# Patient Record
Sex: Female | Born: 1935 | Race: White | Hispanic: No | Marital: Single | Smoking: Former smoker
Health system: Southern US, Community
[De-identification: ages and names within clinical notes are randomized; demographics above are authoritative.]

## PROBLEM LIST (undated history)

## (undated) DIAGNOSIS — I340 Nonrheumatic mitral (valve) insufficiency: Secondary | ICD-10-CM

## (undated) DIAGNOSIS — I071 Rheumatic tricuspid insufficiency: Secondary | ICD-10-CM

## (undated) DIAGNOSIS — I480 Paroxysmal atrial fibrillation: Secondary | ICD-10-CM

## (undated) DIAGNOSIS — I10 Essential (primary) hypertension: Secondary | ICD-10-CM

## (undated) HISTORY — DX: Nonrheumatic mitral (valve) insufficiency: I34.0

## (undated) HISTORY — DX: Essential (primary) hypertension: I10

## (undated) HISTORY — DX: Rheumatic tricuspid insufficiency: I07.1

## (undated) HISTORY — PX: TUBAL LIGATION: SHX77

## (undated) HISTORY — DX: Paroxysmal atrial fibrillation: I48.0

---

## 1998-07-30 ENCOUNTER — Other Ambulatory Visit: Admission: RE | Admit: 1998-07-30 | Discharge: 1998-07-30 | Payer: Self-pay | Admitting: Obstetrics and Gynecology

## 1999-10-19 ENCOUNTER — Other Ambulatory Visit: Admission: RE | Admit: 1999-10-19 | Discharge: 1999-10-19 | Payer: Self-pay | Admitting: *Deleted

## 2000-10-20 ENCOUNTER — Other Ambulatory Visit: Admission: RE | Admit: 2000-10-20 | Discharge: 2000-10-20 | Payer: Self-pay | Admitting: *Deleted

## 2000-12-24 ENCOUNTER — Inpatient Hospital Stay (HOSPITAL_COMMUNITY): Admission: EM | Admit: 2000-12-24 | Discharge: 2000-12-26 | Payer: Self-pay | Admitting: Emergency Medicine

## 2000-12-24 ENCOUNTER — Encounter (INDEPENDENT_AMBULATORY_CARE_PROVIDER_SITE_OTHER): Payer: Self-pay | Admitting: Specialist

## 2000-12-24 ENCOUNTER — Encounter: Payer: Self-pay | Admitting: Emergency Medicine

## 2000-12-24 ENCOUNTER — Encounter: Payer: Self-pay | Admitting: General Surgery

## 2001-10-04 ENCOUNTER — Ambulatory Visit: Admission: RE | Admit: 2001-10-04 | Discharge: 2001-10-04 | Payer: Self-pay | Admitting: Gynecology

## 2002-06-13 ENCOUNTER — Ambulatory Visit: Admission: RE | Admit: 2002-06-13 | Discharge: 2002-06-13 | Payer: Self-pay | Admitting: Gynecology

## 2002-06-21 ENCOUNTER — Other Ambulatory Visit: Admission: RE | Admit: 2002-06-21 | Discharge: 2002-06-21 | Payer: Self-pay | Admitting: Obstetrics and Gynecology

## 2002-06-29 ENCOUNTER — Encounter: Payer: Self-pay | Admitting: *Deleted

## 2002-06-29 ENCOUNTER — Ambulatory Visit (HOSPITAL_COMMUNITY): Admission: RE | Admit: 2002-06-29 | Discharge: 2002-06-29 | Payer: Self-pay | Admitting: *Deleted

## 2005-11-24 ENCOUNTER — Encounter: Admission: RE | Admit: 2005-11-24 | Discharge: 2005-11-24 | Payer: Self-pay | Admitting: Family Medicine

## 2007-01-13 ENCOUNTER — Encounter: Admission: RE | Admit: 2007-01-13 | Discharge: 2007-01-13 | Payer: Self-pay | Admitting: Family Medicine

## 2007-01-18 ENCOUNTER — Emergency Department (HOSPITAL_COMMUNITY): Admission: EM | Admit: 2007-01-18 | Discharge: 2007-01-19 | Payer: Self-pay | Admitting: Emergency Medicine

## 2007-11-16 IMAGING — US US AORTA
1 series · 14 of 25 positions shown · non-contrast
Comparison: none

CLINICAL DATA: Follow up small abdominal aortic aneurysm noted on prior ultrasound of [DATE]. 
 ABDOMINAL AORTIC ULTRASOUND:
TECHNIQUE: Complete retroperitoneal ultrasound examination was performed including evaluation of the abdominal aorta, IVC, common iliac vessel origins, and kidneys.

[Series 1: unknown · 0.33mm/px · 14 of 50 slices shown]
[im 1/50]
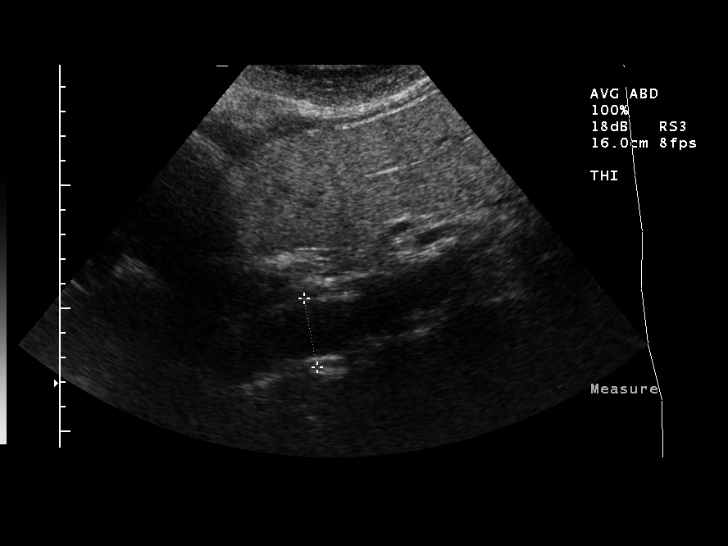
[im 5/50]
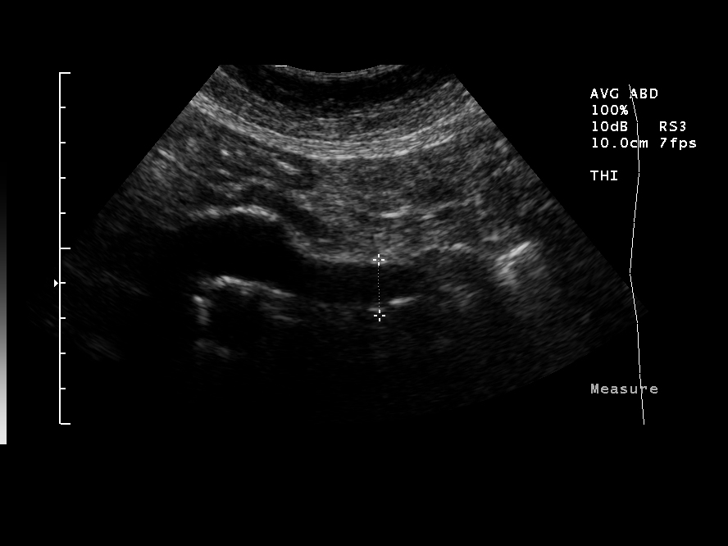
[im 9/50]
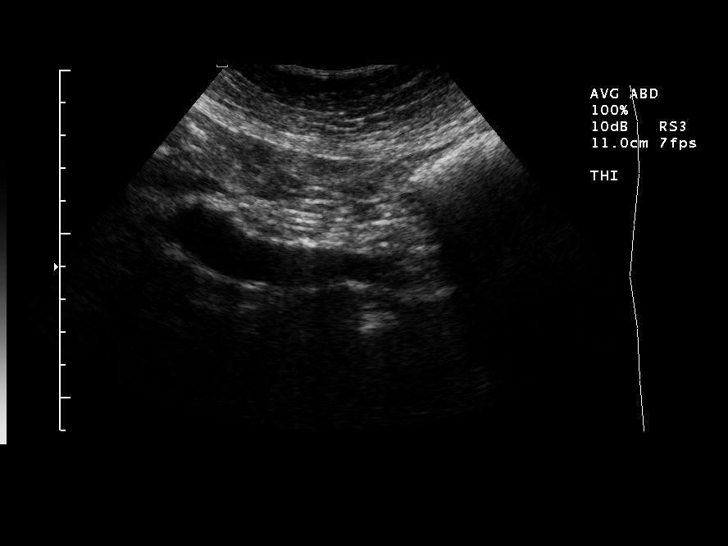
[im 13/50]
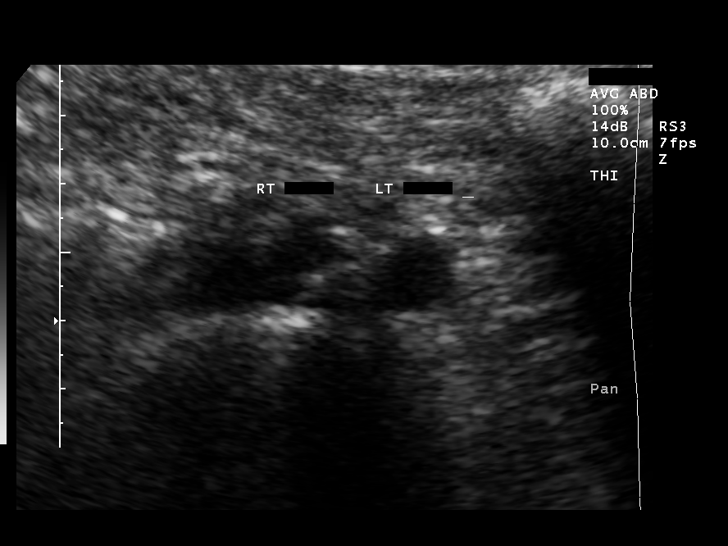
[im 17/50]
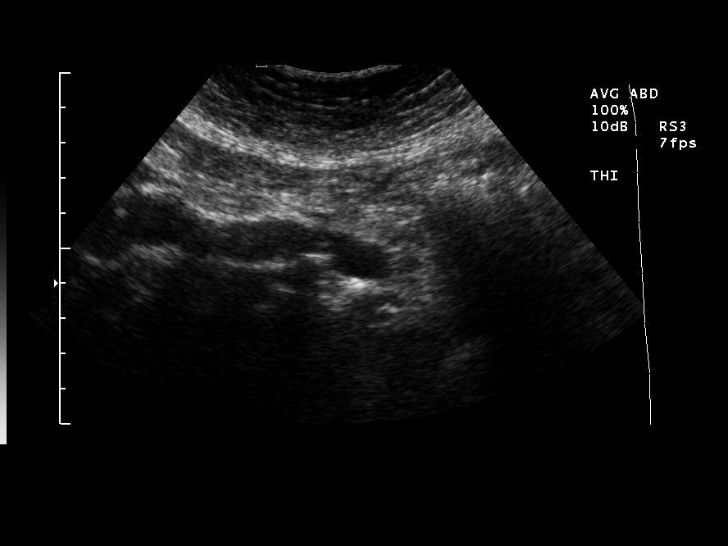
[im 19/50]
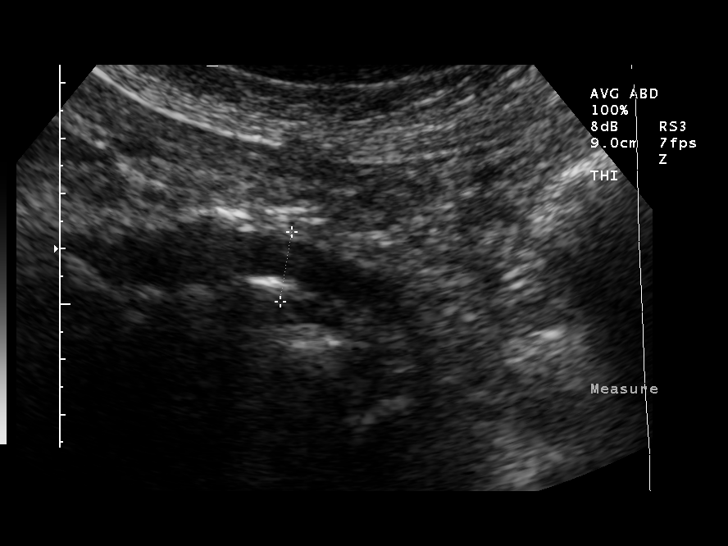
[im 23/50]
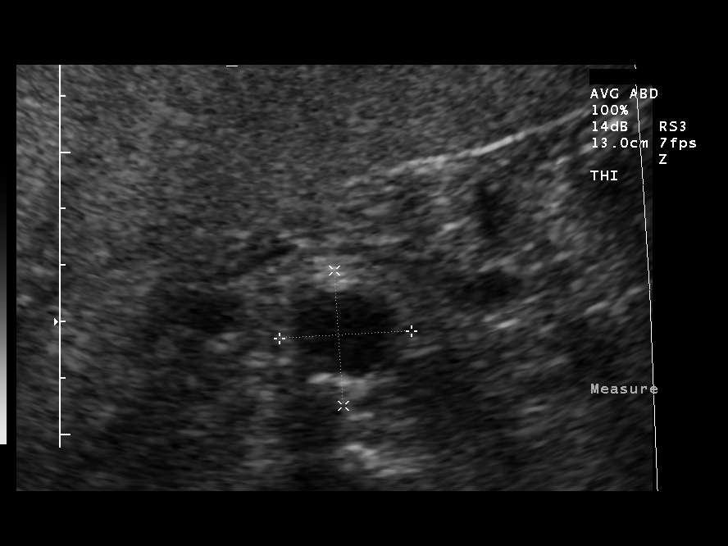
[im 27/50]
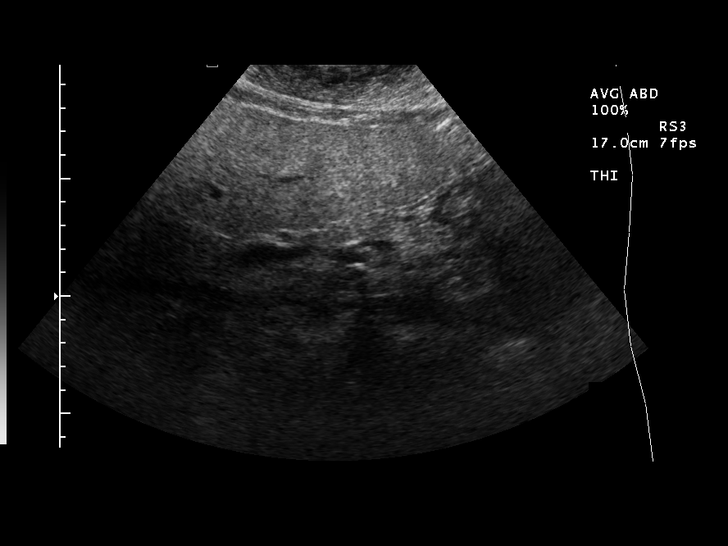
[im 31/50]
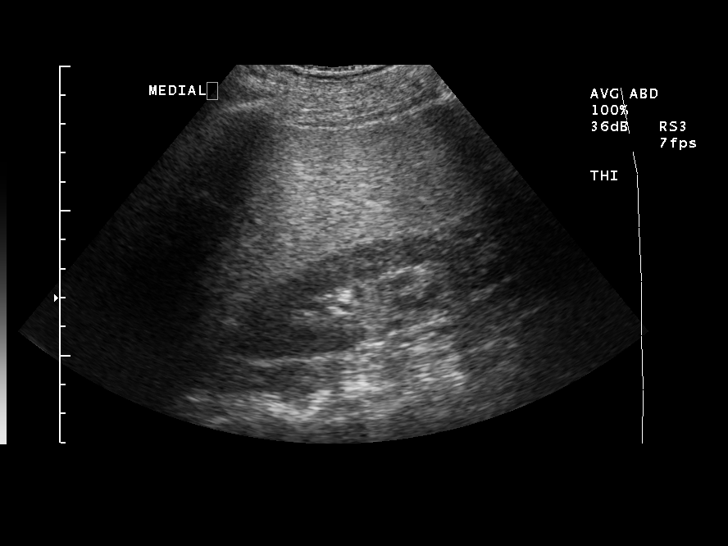
[im 33/50]
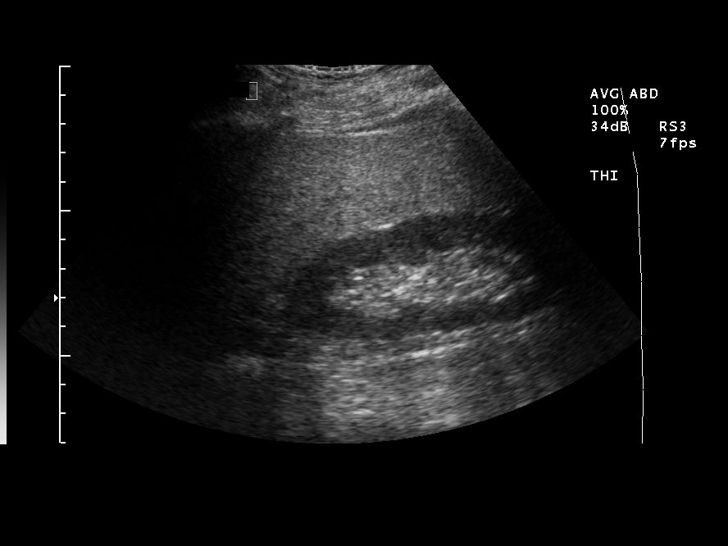
[im 37/50]
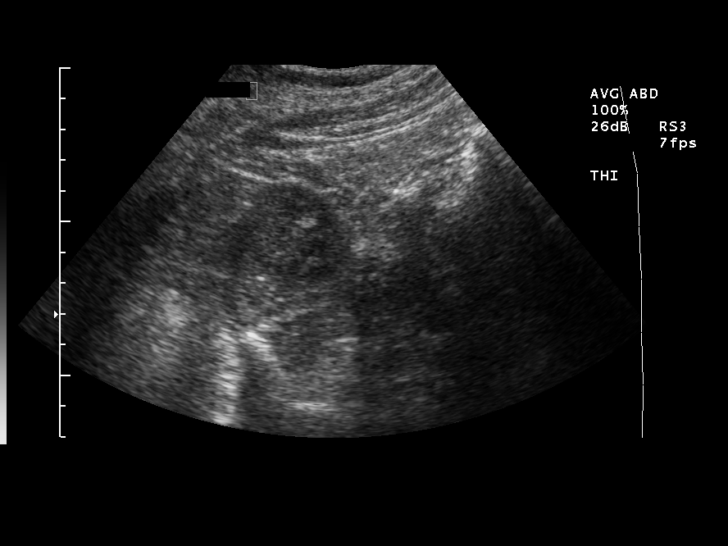
[im 41/50]
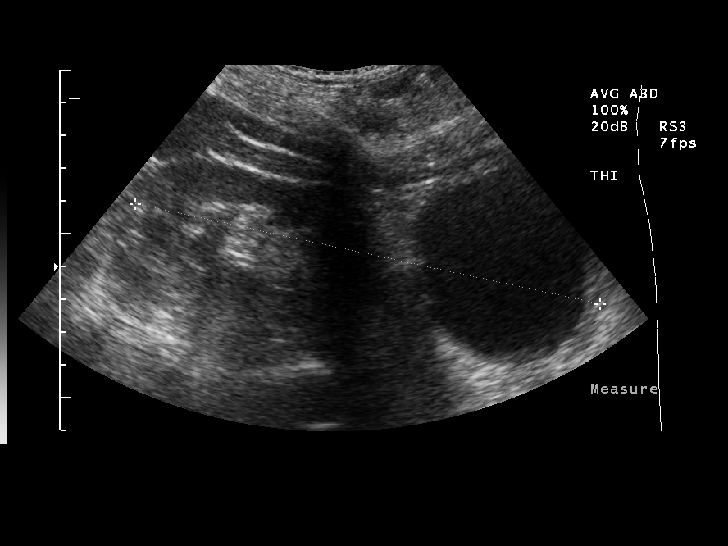
[im 45/50]
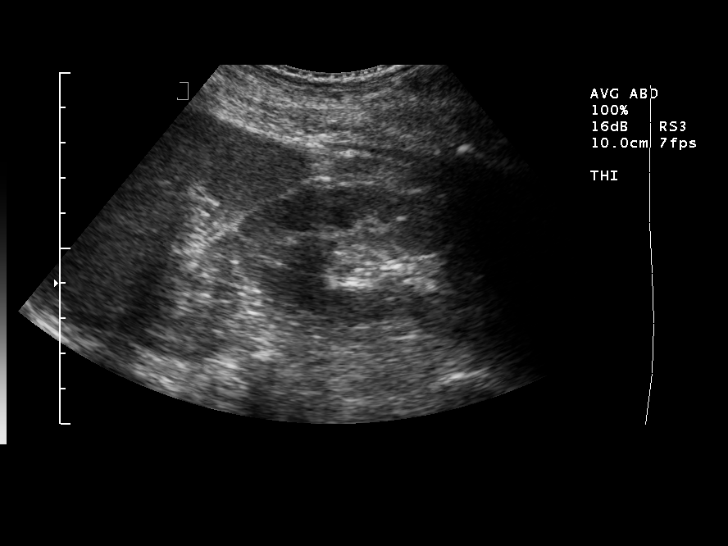
[im 50/50]
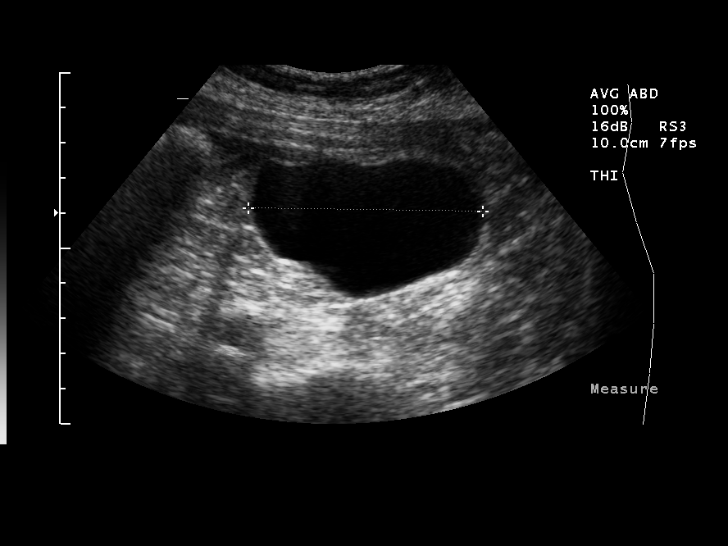

[14 of 25 positions shown; findings below may reference images not displayed]

FINDINGS: On the prior study the small bulge of the abdominal aorta measured 24mm.  The maximum diameter of the abdominal aorta is 29 x 27mm with some atherosclerotic plaque noted.  Continued follow up is recommended.  Common iliac arteries are minimally prominent with the right measuring 15mm in diameter and the left measuring 19mm.  The IVC appears normal.  No hydronephrosis is seen.  The right kidney measures 10.7cm sagittally with the left kidney measuring 14.5cm due to a large cyst inferiorly of 6.7cm.
IMPRESSION: Slight increase in small focal distal abdominal aortic aneurysm now measuring 2.9 x 2.7cm compared to 2.4cm on [DATE].  Recommend continued follow up.

## 2008-04-10 ENCOUNTER — Encounter: Payer: Self-pay | Admitting: Cardiology

## 2009-02-18 ENCOUNTER — Encounter: Payer: Self-pay | Admitting: Cardiology

## 2009-02-18 ENCOUNTER — Ambulatory Visit: Payer: Self-pay | Admitting: Cardiology

## 2009-02-22 ENCOUNTER — Encounter: Payer: Self-pay | Admitting: Cardiology

## 2009-03-11 DIAGNOSIS — I10 Essential (primary) hypertension: Secondary | ICD-10-CM

## 2009-03-11 DIAGNOSIS — I4891 Unspecified atrial fibrillation: Secondary | ICD-10-CM | POA: Insufficient documentation

## 2009-07-08 ENCOUNTER — Ambulatory Visit: Payer: Self-pay | Admitting: Cardiology

## 2009-07-08 DIAGNOSIS — M79609 Pain in unspecified limb: Secondary | ICD-10-CM | POA: Insufficient documentation

## 2009-07-22 ENCOUNTER — Telehealth (INDEPENDENT_AMBULATORY_CARE_PROVIDER_SITE_OTHER): Payer: Self-pay | Admitting: *Deleted

## 2009-07-28 ENCOUNTER — Ambulatory Visit: Payer: Self-pay | Admitting: Cardiology

## 2009-07-30 ENCOUNTER — Telehealth (INDEPENDENT_AMBULATORY_CARE_PROVIDER_SITE_OTHER): Payer: Self-pay | Admitting: *Deleted

## 2009-08-04 ENCOUNTER — Ambulatory Visit: Payer: Self-pay | Admitting: Cardiology

## 2009-08-05 ENCOUNTER — Telehealth: Payer: Self-pay | Admitting: Cardiology

## 2009-08-12 ENCOUNTER — Ambulatory Visit: Payer: Self-pay | Admitting: Cardiology

## 2009-08-15 ENCOUNTER — Encounter: Payer: Self-pay | Admitting: Internal Medicine

## 2009-08-27 ENCOUNTER — Ambulatory Visit: Payer: Self-pay

## 2009-08-27 DIAGNOSIS — I080 Rheumatic disorders of both mitral and aortic valves: Secondary | ICD-10-CM

## 2009-12-30 ENCOUNTER — Telehealth: Payer: Self-pay | Admitting: Cardiology

## 2010-03-04 ENCOUNTER — Ambulatory Visit: Payer: Self-pay | Admitting: Cardiology

## 2010-05-29 ENCOUNTER — Telehealth: Payer: Self-pay | Admitting: Cardiology

## 2010-12-10 NOTE — Progress Notes (Signed)
Summary: palp, sob x4 days- would like to be seen today**LM/nm  Phone Note Call from Patient Call back at Home Phone [phone]   Caller: Patient Reason for Call: Talk to Nurse Complaint: Breathing Problems Summary of Call: per pt calling c/o palps, sob x4 days. pt aware that jh/ pam are  not in the office. pt asking to be seen today.  Initial call taken by: Lorne Skeens,  May 29, 2010 10:01 AM  Follow-up for Phone Call        [facility]. Ollen Gross, RN, BSN  May 29, 2010 11:26 AM  Pt called back. She states yesterday and today about 10:00 when she was outdors watering her plants. She started having  palpitatations and problems with breathing. She felt like her heart was in her stomach area onder left breast. She decided to stay in the house today and rest after the last episode. She states feels better now. Pt denies palpitations , SOB. Pt states" I am feeling better now that I am in a cool place" Pt. adviced to stay indors in a high temperatue weather. Pt. verbalized understanding.  Follow-up by: Ollen Gross, RN, BSN,  May 29, 2010 4:44 PM

## 2010-12-10 NOTE — Progress Notes (Signed)
Summary: SOB,HEAVY IN CHEST  Phone Note Call from Patient Call back at Home Phone [phone]   Caller: Patient Summary of Call: SOB,AND HEAVY IN CHEST Initial call taken by: Judie Grieve,  December 30, 2009 4:23 PM  Follow-up for Phone Call        Spoke with pt. She complains of increased SOB since last PM and throughout day today.  Also complain of uncomfortable feeling in upper stomach/lower chest.  States it is been constant since last evening.  Also notes difficulty taking a deep breath and that she is retaining fluid (swelling in ankles).  Has not weighed herself.  Wondering if she needs to come in to see Dr. Coquille Lions.  States she sees him in [place].  Told pt I would forward to [facility] and have her call pt. Dossie Arbour, RN, BSN  December 30, 2009 4:35 PM   Additional Follow-up for Phone Call Additional follow up Details #1::        NO HX OF CHF NOTED.  DOES HAVE  HISTORY OF A FIB.  ?WHAT IS HEART RATE.  LAST NIGHT AFTER GOING TO BED FELT SOB WHILE LAYING DOWN.  NO PAIN BUT FEELS CHEST IS HEAVY.  HEART RATE USUALLY RUNS ABOUT 55 BMP/   HAS A FEELING OF SORENESS IN EPIGASTRIC AREA.   HR 61  BP 138/87 A LITTLE HIGH FOR HER. DOES NOT FEEL LIKE HER HEART IS OUT OF RHYTHM.  INSTRUCTED PT TO GO TO ED AT [facility] OF S/S WORSEN.  SHE WOULD PREFER TO WAIT TO SEE DR Declin Rajan IN THE [place] OFFICE.  APPT SCHEDULED HOWEVER PT IS INSTRUCTED TO CALL BACK IF S/S CHANGE BEFORE THEN.  SHE STATES UNDERSTANDING Additional Follow-up by: Charolotte Capuchin, RN,  December 30, 2009 5:49 PM

## 2010-12-10 NOTE — Assessment & Plan Note (Signed)
Summary: [facility]   Visit Type:  Follow-up Primary Provider:  Dr. Marinda Elk  CC:  Atrial Fibrillation.  History of Present Illness: The patient presents for followup of her atrial fibrillation. Coincidentally she went back into this rhythm yesterday. It usually lasts for about 36 hours. She can feel it but it doesn't particularly bother her. She does not describe presyncope or syncope. She is not having any chest pressure, neck or arm discomfort. She has no shortness of breath, PND or orthopnea. She does have some mild ankle edema but she has been on her feet quite a bit.  Current Medications (verified): 1)  Otc Loratidine .... Daily 2)  Cardizem Cd 120 Mg Xr24h-Cap (Diltiazem Hcl Coated Beads) .... One By Mouth Daily 3)  Lanoxin 0.125 Mg Tabs (Digoxin) .... One By Mouth Daily 4)  Simvastatin 40 Mg Tabs (Simvastatin) .... One By Mouth Daily 5)  Benazepril-Hydrochlorothiazide 20-12.5 Mg Tabs (Benazepril-Hydrochlorothiazide) .... One By Mouth Daily 6)  Hydrocodone .... As Needed 7)  Warfarin Sodium 2.5 Mg Tabs (Warfarin Sodium) .... As Directed 8)  Calcium 600 1500 Mg Tabs (Calcium Carbonate) .... Daily 9)  Benadryl 25 Mg Caps (Diphenhydramine Hcl) .... By Mouth At Bedtime  Allergies (verified): 1)  ! Sulfa  Past History:  Past Medical History: Reviewed history from 08/27/2009 and no changes required. HTN x 20 years PAF Moderate mitral regurgitation Moderate tricuspid regurgitation  Past Surgical History: Reviewed history from 05/09/2009 and no changes required. Tubal Ligation  Review of Systems       As stated in the HPI and negative for all other systems.   Vital Signs:  Patient profile:   77 year old female Height:      64 inches Weight:      171 pounds BMI:     29.46 Pulse rate:   77 / minute Resp:     18 per minute BP sitting:   134 / 82  (right arm)  Vitals Entered By: Marrion Coy, CNA (March 04, 2010 11:37 AM)  Physical Exam  General:  Well  developed, well nourished, in no acute distress. Head:  normocephalic and atraumatic Eyes:  PERRLA/EOM intact; conjunctiva and lids normal. Mouth:  Gums and palate normal. Oral mucosa normal. Neck:  Neck supple, no JVD. No masses, thyromegaly or abnormal cervical nodes. Chest Wall:  no deformities or breast masses noted Lungs:  Clear bilaterally to auscultation and percussion. Abdomen:  Bowel sounds positive; abdomen soft and non-tender without masses, organomegaly, or hernias noted. No hepatosplenomegaly. Msk:  Back normal, normal gait. Muscle strength and tone normal. Extremities:  No clubbing or cyanosis. Neurologic:  Alert and oriented x 3. Skin:  Intact without lesions or rashes. Psych:  Normal affect.   Detailed Cardiovascular Exam  Neck    Carotids: Carotids full and equal bilaterally without bruits.      Neck Veins: Normal, no JVD.    Heart    Inspection: no deformities or lifts noted.      Palpation: normal PMI with no thrills palpable.      Auscultation: 2/6 apical holosystolic murmur, nonradiating, no diastolic murmurs, S1 and S2 within normal limits, no S3, no S4  Vascular    Abdominal Aorta: no palpable masses, pulsations, or audible bruits.      Femoral Pulses: normal femoral pulses bilaterally.      Pedal Pulses: normal pedal pulses bilaterally.      Radial Pulses: normal radial pulses bilaterally.      Peripheral Circulation: no clubbing, cyanosis,  or edema noted with normal capillary refill.     Impression & Recommendations:  Problem # 1:  FIBRILLATION, ATRIAL (ICD-427.31) She happens to be in fibrillation right now but is not particularly symptomatic. She will let me know if this does not convert or if it bothers her. Of note we talked at length about Pradaxa.  If she has normal renal function she would be a good candidate for this. She is to see her primary provider soon and I will defer basic metabolic profile workup to him. If this is normal I would be in  favor of this switch. I would be happy to assist in this in any way. Orders: EKG w/ Interpretation (93000)  Her updated medication list for this problem includes:    Lanoxin 0.125 Mg Tabs (Digoxin) ..... One by mouth daily    Warfarin Sodium 2.5 Mg Tabs (Warfarin sodium) .Marland Kitchen... As directed  Problem # 2:  ESSENTIAL HYPERTENSION, BENIGN (ICD-401.1) Her blood pressure is controlled. She will continue the meds as listed.  Problem # 3:  MITRAL INSUFFICIENCY (ICD-396.3) I will try to review outside records of her last echocardiogram to determine the need for surveillance followup.  Problem # 4:  FOOT PAIN, BILATERAL (ICD-729.5) I have taken the liberty of giving her a prescription for Vicodin which she uses sparingly.  Patient Instructions: 1)  Your physician recommends that you schedule a follow-up appointment in: 6 months in [place] with Dr Antoine Poche 2)  Your physician recommends that you continue on your current medications as directed. Please refer to the Current Medication list given to you today. 3)  You have been diagnosed with atrial fibrillation.  Atrial fibrillation is a condition in which one of the upper chambers of the heart has extra electrical cells causing it to beat very fast.  Please see the handout/brochure given to you today for further information. Prescriptions: HYDROCODONE-ACETAMINOPHEN 5-500 MG TABS (HYDROCODONE-ACETAMINOPHEN) one by mouth as needed  #30 x 0   Entered by:   Charolotte Capuchin, RN   Authorized by:   Rollene Rotunda, MD, [facility]   Signed by:   Charolotte Capuchin, RN on 03/04/2010   Method used:   Print then Give to Patient   RxID:   [id number]

## 2010-12-16 ENCOUNTER — Encounter: Payer: Self-pay | Admitting: Cardiology

## 2010-12-16 ENCOUNTER — Ambulatory Visit (INDEPENDENT_AMBULATORY_CARE_PROVIDER_SITE_OTHER): Payer: Medicare Other | Admitting: Cardiology

## 2010-12-16 DIAGNOSIS — I059 Rheumatic mitral valve disease, unspecified: Secondary | ICD-10-CM

## 2010-12-16 DIAGNOSIS — R9431 Abnormal electrocardiogram [ECG] [EKG]: Secondary | ICD-10-CM | POA: Insufficient documentation

## 2010-12-16 DIAGNOSIS — I4891 Unspecified atrial fibrillation: Secondary | ICD-10-CM

## 2010-12-24 NOTE — Assessment & Plan Note (Signed)
Summary: f77m 427.31,424.73mj per pt call rs from bumplist gd/tt   Visit Type:  Follow-up Primary Provider:  Dr. Marinda Elk  CC:  Atrial Fibrillation.  History of Present Illness: The patient presents for followup of atrial fibrillation. Since I last saw her she's had no new complaints. She unfortunately is somewhat limited by knee pain and also having to care for a chronically ill husband. She does occasionally get palpitations and notices these and thinks she goes in and out of fibrillation. However, she has no presyncope or syncope. She denies any chest pressure, neck or arm discomfort though again her functional status is. She does occasionally get some shortness of breath no PND or orthopnea. She said some increased lower extremity swelling. She does tolerate her medications as listed. She complains of burning in her extremities that has been slowly progressive.  Current Medications (verified): 1)  Otc Loratidine .... Daily 2)  Lanoxin 0.125 Mg Tabs (Digoxin) .... One By Mouth Daily 3)  Simvastatin 40 Mg Tabs (Simvastatin) .... One By Mouth Daily 4)  Benazepril-Hydrochlorothiazide 20-12.5 Mg Tabs (Benazepril-Hydrochlorothiazide) .... One By Mouth Daily 5)  Pradaxa 150 Mg Caps (Dabigatran Etexilate Mesylate) .Marland Kitchen.. 1 By Mouth Two Times A Day 6)  Calcium 600 1500 Mg Tabs (Calcium Carbonate) .... Daily 7)  Benadryl 25 Mg Caps (Diphenhydramine Hcl) .... By Mouth At Bedtime 8)  Hydrocodone-Acetaminophen 5-500 Mg Tabs (Hydrocodone-Acetaminophen) .... One By Mouth As Needed  Allergies (verified): 1)  ! Sulfa  Past History:  Past Medical History: Reviewed history from 08/27/2009 and no changes required. HTN x 20 years PAF Moderate mitral regurgitation Moderate tricuspid regurgitation  Past Surgical History: Reviewed history from 05/09/2009 and no changes required. Tubal Ligation  Review of Systems       As stated in the HPI and negative for all other systems.   Vital  Signs:  Patient profile:   77 year old female Height:      64 inches Weight:      168 pounds BMI:     28.94 Pulse rate:   61 / minute Resp:     16 per minute BP sitting:   110 / 60  (right arm)  Vitals Entered By: Marrion Coy, CNA (December 16, 2010 11:53 AM)  Physical Exam  General:  Well developed, well nourished, in no acute distress. Head:  normocephalic and atraumatic Eyes:  PERRLA/EOM intact; conjunctiva and lids normal. Mouth:  Gums and palate normal. Oral mucosa normal. Neck:  Neck supple, no JVD. No masses, thyromegaly or abnormal cervical nodes. Chest Wall:  no deformities or breast masses noted Lungs:  Clear bilaterally to auscultation and percussion. Abdomen:  Bowel sounds positive; abdomen soft and non-tender without masses, organomegaly, or hernias noted. No hepatosplenomegaly. Msk:  Back normal, normal gait. Muscle strength and tone normal. Extremities:  No clubbing or cyanosis. Neurologic:  Alert and oriented x 3. Skin:  Intact without lesions or rashes. Cervical Nodes:  no significant adenopathy Inguinal Nodes:  no significant adenopathy Psych:  Normal affect.   Detailed Cardiovascular Exam  Neck    Carotids: Carotids full and equal bilaterally without bruits.      Neck Veins: Normal, no JVD.    Heart    Inspection: no deformities or lifts noted.      Palpation: normal PMI with no thrills palpable.      Auscultation: 2/6 apical holosystolic murmur, nonradiating, no diastolic murmurs, S1 and S2 within normal limits, no S3, no S4  Vascular    Abdominal  Aorta: no palpable masses, pulsations, or audible bruits.      Femoral Pulses: normal femoral pulses bilaterally.      Pedal Pulses: normal pedal pulses bilaterally.      Radial Pulses: normal radial pulses bilaterally.      Peripheral Circulation: no clubbing, cyanosis, or edema noted with normal capillary refill.     EKG  Procedure date:  12/16/2010  Findings:      Atrial fibrillation, rate 60,  axis within normal limits, early transition in lead V2, deep ST depression in the lateral leads unchanged from previous  Impression & Recommendations:  Problem # 1:  ELECTROCARDIOGRAM, ABNORMAL (ICD-794.31)  Ptinued to have a markedly abnormal EKG. She does have some risk factors and some dyspnea. I would like  to screen her with a stress test but 1) she couldn't walk on a treadmill and 2) her EKG could not be interpreted. Therefore, she will have a pharmacologic perfusion study. It has been several years since any stress testing.  Orders: Nuclear Stress Test (Nuc Stress Test)  Problem # 2:  MITRAL INSUFFICIENCY (ICD-396.3) She has a history of mitral regurgitation and she has not had an echocardiogram several years. I will follow up with one of these. Orders: Echocardiogram (Echo)  Problem # 3:  FIBRILLATION, ATRIAL (ICD-427.31) She tolerates paroxysmal atrial fibrillation and maintain his Coumadin. No change in therapy is indicated. Orders: EKG w/ Interpretation (93000)  Patient Instructions: 1)  Your physician recommends that you schedule a follow-up appointment in: 12 months with Dr Antoine Poche 2)  Your physician recommends that you continue on your current medications as directed. Please refer to the Current Medication list given to you today. 3)  Your physician has requested that you have an echocardiogram.  Echocardiography is a painless test that uses sound waves to create images of your heart. It provides your doctor with information about the size and shape of your heart and how well your heart's chambers and valves are working.  This procedure takes approximately one hour. There are no restrictions for this procedure. 4)  Your physician has requested that you have an exercise stress myoview.  For further information please visit [email or web address]  Please follow instruction sheet, as given.

## 2010-12-31 ENCOUNTER — Other Ambulatory Visit (HOSPITAL_COMMUNITY): Payer: MEDICARE

## 2011-01-04 ENCOUNTER — Telehealth: Payer: Self-pay | Admitting: Cardiology

## 2011-01-05 ENCOUNTER — Telehealth (INDEPENDENT_AMBULATORY_CARE_PROVIDER_SITE_OTHER): Payer: Self-pay | Admitting: Radiology

## 2011-01-06 ENCOUNTER — Encounter: Payer: Self-pay | Admitting: Cardiology

## 2011-01-06 ENCOUNTER — Ambulatory Visit (HOSPITAL_COMMUNITY): Payer: Medicare Other | Attending: Cardiovascular Disease

## 2011-01-06 DIAGNOSIS — R0789 Other chest pain: Secondary | ICD-10-CM

## 2011-01-06 DIAGNOSIS — I252 Old myocardial infarction: Secondary | ICD-10-CM | POA: Insufficient documentation

## 2011-01-06 DIAGNOSIS — I059 Rheumatic mitral valve disease, unspecified: Secondary | ICD-10-CM | POA: Insufficient documentation

## 2011-01-06 DIAGNOSIS — I10 Essential (primary) hypertension: Secondary | ICD-10-CM | POA: Insufficient documentation

## 2011-01-06 DIAGNOSIS — R011 Cardiac murmur, unspecified: Secondary | ICD-10-CM | POA: Insufficient documentation

## 2011-01-06 DIAGNOSIS — I4891 Unspecified atrial fibrillation: Secondary | ICD-10-CM | POA: Insufficient documentation

## 2011-01-07 ENCOUNTER — Telehealth: Payer: Self-pay | Admitting: Cardiology

## 2011-01-14 NOTE — Progress Notes (Signed)
Summary: sob,B/P high,stomach pain  Phone Note Call from Patient Call back at Home Phone [phone]   Caller: Daughter/ Mindy Summary of Call: Pt pain in center of stomach and B/P 172/94 and sob Initial call taken by: Judie Grieve,  January 04, 2011 3:50 PM  Follow-up for Phone Call        pt states she is having indegestion, the worse ever for the last 3 days. She has tried Pepcid, Tums, milk, watered down vinegar all without relief.  Pain started in the center of chest in what is described as the epigastric area.  feels tight, cramping right below sternum, cramping in chest.  Heart out of rythum for 1 month, has a stress test scheduled Wed. BP 145/85  HR 44  states she  called this am and was told if she was having indegestion to call her primary care MD.  She called her primary MD who told her to call 911 and have them check her out and possibly do an EKG.  She has not done this. Had pain in her neck with radiation into right breast for over 1 hour last night, fullness  in chest now.  Some SOB with activity. NO N/V. Does not have SL ntg.  States she is very stress and not sleeping well.  Husband is at home with Hospice - and she doesn't want to live him.  Pt is instructed to call 911 for eval and to be transported to the closes ED.   Pt states she does not know what she will do at this point. Follow-up by: Charolotte Capuchin, RN,  January 04, 2011 4:42 PM

## 2011-01-14 NOTE — Progress Notes (Signed)
Summary: pt has questions       Phone Note Call from Patient Call back at Home Phone [phone]   Caller: Patient Reason for Call: Talk to Nurse, Talk to Doctor, Lab or Test Results Summary of Call: pt had testing yesterday and she wants to talk to you about it and she some questions about it. Initial call taken by: Omer Jack,  January 07, 2011 3:03 PM  Follow-up for Phone Call        Micah Flesher to Dr Foy [place]adal[place] office and had an EKG that he said was about  the same as the copy of the one we gave her at her last office visit.  Pt had a nuc study and 2 D echo yesterday and is wanting results.  I reviewed the results of the nuc study with her pt wants to know what she needs to do.  Pt is aware that the echo has not been signed off on as of yet.  Pt aware once Dr Antoine Poche reviews I will call her back.  Of note - pt's husband has Hospice and is at home.  Pt is very stressed and feels like her chest tightness is coming from that.  Dr Foy [place] started her on Nexium yesterday and she will see how it helps her. Follow-up by: Charolotte Capuchin, RN,  January 07, 2011 3:59 PM  Additional Follow-up for Phone Call Additional follow up Details #1::        Nuclear negative.  Echo no significant abnormalities. Let us know if Nexium doens't help. Additional Follow-up by: Rollene Rotunda, MD, [facility],  January 08, 2011 4:31 PM    Additional Follow-up for Phone Call Additional follow up Details #2::    pt aware of results of testing and states she is feeling better today.  She understands to call back if problems continue otherwise she is due back in one year. Follow-up by: Charolotte Capuchin, RN,  January 08, 2011 5:03 PM

## 2011-01-14 NOTE — Progress Notes (Signed)
Summary: Nuc Pre-Procedure  Phone Note Outgoing Call Call back at Home Phone [phone]   Call placed by: Henrine Screws Call placed to: Patient Reason for Call: Confirm/change Appt Summary of Call: Reviewed information on Myoview Information Sheet (see scanned document for further details).  Spoke with patient.     Nuclear Med Background Indications for Stress Test: Evaluation for Ischemia   History: Echo, Myocardial Perfusion Study  History Comments: 2009- Normal MPS. EF= 65% 2009- Echo- Mod. MR and TR H/O PAF  Symptoms: Chest Pain, Chest Pressure, Chest Tightness, DOE    Nuclear Pre-Procedure Cardiac Risk Factors: Hypertension, Lipids Height (in): 64  Nuclear Med Study Referring MD:  Antoine Poche MD, Fayrene Fearing

## 2011-01-14 NOTE — Assessment & Plan Note (Signed)
Summary: Cardiology Nuclear Testing  Nuclear Med Background Indications for Stress Test: Evaluation for Ischemia   History: Echo, Myocardial Perfusion Study  History Comments: ''09 MPS:EF=65%; '09 Echo:EF=55%, moderate MR/TR; h/o PAF  Symptoms: Chest Pain, Chest Tightness, Dizziness, DOE, Fatigue, Palpitations, Rapid HR, SOB    Nuclear Pre-Procedure Cardiac Risk Factors: Family History - CAD, History of Smoking, Hypertension, Lipids, NIDDM Caffeine/Decaff Intake: None NPO After: 8:30 AM Lungs: Clear.  O2 Sat 98% on RA. IV 0.9% NS with Angio Cath: 20g     IV Site: R Antecubital IV Started by: Stanton Kidney, EMT-P Chest Size (in) 34     Cup Size C     Height (in): 63 Weight (lb): 162 BMI: 28.80  Nuclear Med Study 1 or 2 day study:  1 day     Stress Test Type:  Eugenie Birks Reading MD:  Marca Ancona, MD     Referring MD:  Rollene Rotunda, MD Resting Radionuclide:  Technetium 51m Tetrofosmin     Resting Radionuclide Dose:  10.8 mCi  Stress Radionuclide:  Technetium 8m Tetrofosmin     Stress Radionuclide Dose:  33.0 mCi   Stress Protocol   Lexiscan: 0.4 mg   Stress Test Technologist:  Rea [employer], CMA-N     Nuclear Technologist:  Harlow Asa, CNMT  Rest Procedure  Myocardial perfusion imaging was performed at rest 45 minutes following the intravenous administration of Technetium 65m Tetrofosmin.  Stress Procedure  The patient received IV Lexiscan 0.4 mg over 15-seconds.  Technetium 39m Tetrofosmin injected at 30-seconds.  There was a brief episode of junctional rhythm and PAC's with infusion.  She did c/o chest tightness.  Quantitative spect images were obtained after a 45 minute delay.  QPS Raw Data Images:  Normal; no motion artifact; normal heart/lung ratio. Stress Images:  Normal homogeneous uptake in all areas of the myocardium. Rest Images:  Normal homogeneous uptake in all areas of the myocardium. Subtraction (SDS):  There is no evidence of scar or  ischemia. Transient Ischemic Dilatation:  1.04  (Normal <1.22)  Lung/Heart Ratio:  .25  (Normal <0.45)  Quantitative Gated Spect Images QGS EDV:  89 ml QGS ESV:  31 ml QGS EF:  65 % QGS cine images:  Normal wall motion.    Overall Impression  Exercise Capacity: Lexiscan with no exercise. BP Response: Normal blood pressure response. Clinical Symptoms: chest tight ECG Impression: Baseline 1-2 mm ST depression in leads V4/V5.  Minimal change with infusion.  Overall Impression: Normal stress nuclear study.  Appended Document: Cardiology Nuclear Testing pt aware of results

## 2011-02-03 ENCOUNTER — Other Ambulatory Visit: Payer: Self-pay | Admitting: Orthopedic Surgery

## 2011-02-03 DIAGNOSIS — M25511 Pain in right shoulder: Secondary | ICD-10-CM

## 2011-02-06 ENCOUNTER — Other Ambulatory Visit: Payer: Medicare Other

## 2011-03-26 NOTE — Op Note (Signed)
[facility]  Patient:    Debra Chen, Debra Chen                     MRN: [id number] Proc. Date: 12/24/00 Adm. Date:  [id number] Attending:  Henrene Dodge                           Operative Report  PREOPERATIVE DIAGNOSIS:  Acute cholecystitis and possible common duct stone.  POSTOPERATIVE DIAGNOSIS:  Acute cholecystitis and possible common duct stone.  OPERATION:  Laparoscopic cholecystectomy with cholangiogram.  SURGEON:  Anselm Pancoast. Zachery Dakins, M.D.  ASSISTANT:  Zigmund Daniel, M.D.  ANESTHESIA:  General.  HISTORY:  The patient is a 77 year old Caucasian female who presented to the emergency room this morning with severe abdominal pain, epigastric nature over the last 24 hours.  She says that she has had known gallstones and elected not to proceed with any surgery, but then started having these severe episodes of pain, and when she was seen by the emergency room initially physician, she was in sort of marked of upper abdomen tenderness.  She was given pain medication and repeated ultrasound that shows an acutely edematous gallbladder, very swollen with fluid around it, definitely stones, and a common bile duct of about 7 mm in size.  On laboratory studies, her bilirubin was not elevated, but her alkaline phosphatase and SGOT were both elevated in approximately the 300-400 range.  I discussed with the patient that she definitely has acute cholecystitis.  Her amylase was normal as was the lipase, and whether she has a common duct stone or not, we are not sure, but I would recommend that we proceed on with a laparoscopic cholecystectomy and cholangiogram, and she may need an ERCP post procedure.  The patient is in agreement with this and was given PAS stockings and given 3 g of Unasyn.  DESCRIPTION OF PROCEDURE:  She was taken to the operative suite and induction of general anesthesia endotracheal tube, and an oral tube into the stomach. The  abdomen was prepped with Betadine surgical scrubbing solution and draped in a sterile manner.  She is fairly large.  A small incision was made below the umbilicus and the fascia identified.  This was picked up between the Kochers and a small opening made, and she has kind of a single layer of thinned out area of the fascia at this point.  A traction suture of 0 Vicryl was placed and the Hasson cannula introduced and carbon dioxide infused, and the gallbladder was very swollen and very edematous, but not gangrenous.  The upper 10 mm trocar was placed under direct vision through the falciform after anesthetizing the fascia, and the two lateral 5 mm trocars were placed ________.  The gallbladder was retracted upward and outward.  The proximal gallbladder and the adhesions were taken down around it, opening up the peritoneum.  The cystic duct was identified and clipped flush with the gallbladder.  A small opening was made.  There was definitely a back pressure with kind of thin bile gushing back.  The Taut catheter that had been cut off was introduced into the cystic duct, held in place with a clip, and then a cholangiogram obtained.  The biliary system was definitely dilated, but there was good flow into the duodenum.  We repeated this on I think three different views and could never see a little stone, but she certainly may have a small stone  that we are just not visualizing.  The intrahepatic radicles are dilated, but otherwise normal.  The radiologist came and looked at all the films and views, and feels that no definite stone could be demonstrated, but there is a dilated biliary system, and whether this is acute inflammation because she has passed a common duct stone or whether there is a small stone that we are not visualizing and ________.  The catheter was removed.  The cystic duct was triply clipped and then divided.  The cystic artery was divided.  The deltoid cut proximally,  singly, and distally divided, and then this area of inflamed gallbladder was removed with the hook electrocautery and good hemostasis obtained.  The gallbladder was placed in an Endocatch bag.  The camera was placed in the upper 10 mm trocar and the gallbladder withdrawn through the fascia at the umbilicus.  The fascia was closed with a figure-of-eight, ______.  The pursestring had been placed in a second figure-of-eight.  The wound irrigated and fluid had been removed, and then the carbon dioxide released, and the upper trocar withdrawn.  The patient tolerated the procedure nicely and the subcutaneous wounds were closed with 4-0 Vicryl.  Benzoin and Steri-Strips on the skin.  I will repeat the liver function studies tomorrow.  If they are doing better, I would not proceed with ERCP.  If she continues to have pain and definitely abnormal liver tests, then she will need an ERCP and possibly sphincterotomy.  The patient tolerated the procedure nicely, was extubated, and taken to the recovery room.  We will let her have a diet today, but keep her n.p.o. in the morning in case an ERCP is possibly needed. DD:  12/24/00 TD:  12/25/00 Job: [id number]

## 2011-03-26 NOTE — Consult Note (Signed)
   NAME:  Debra Chen, Debra Chen                        ACCOUNT NO.:  [id number]   MEDICAL RECORD NO.:  [id number]                   PATIENT TYPE:  OUT   LOCATION:  GYN                                  FACILITY:  [facility]   PHYSICIAN:  Daniel L. Clarke-Pearson, M.D.      DATE OF BIRTH:  09/17/1926   DATE OF CONSULTATION:  DATE OF DISCHARGE:                                 GYN CONSULTATION   HISTORY OF PRESENT ILLNESS:  This is a 77 year old white female who returns  because of now increasing drainage from her left vulva.  She was previously  seen on November 27, at which time she was doing fine.  She denies any pain  in this area or any fever.  This is at least the third episode where she has  developed an abscess in this area.   REVIEW OF SYSTEMS:  Otherwise negative.   PHYSICAL EXAMINATION:  VITAL SIGNS:  Weight 196 pounds.  Blood pressure  156/72.  ABDOMEN:  Soft and nontender.  No mass, organomegaly, ascites, or hernias  noted.  PELVIC:  EGBUS is normal except for a slit-like opening about 5 mm long in  the region of the Bartholin gland.  This is draining pus.  Palpation of this  area reveals no masses or nodularity or any large abscess.  Vaginal exam is  otherwise normal.   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  This  has been previously incised and drained and is draining well today.  However, given the chronic nature of this process, I would suggest that it  be removed.   I discussed this patient's problem with Dr. Myrlene Broker, and Dr. Katrinka Blazing will  undertake this procedure once the patient's infection is cleared up.  Today  she is given a prescription for Augmentin 500 mg q.12h.  Dr. Michaelle Copas office  will coordinate with the patient to schedule surgery.   ADDENDUM:  The patient's past medical history is relevant for an arrhythmia,  which I believe is atrial fibrillation, in that she is taking Coumadin.  She  also has hypertension.   CURRENT  MEDICATIONS:  Coumadin, with the dose to achieve an INR of 2.0.  Lotensin.  Zocor.  Evista.  Cartia.  A beta blocker.    PAST SURGICAL HISTORY:  1. Bilateral tubal ligation.  2. Cholecystectomy.   ALLERGIES:  SULFA.                                               Daniel L. Stanford Breed, M.D.    DLC/MEDQ  D:  06/13/2002  T:  06/17/2002  Job:  [id number]   cc:   Laqueta Linden, M.D.  [address]  [place]  [place] [address]  Fax: [phone]   Telford Nab, R.N.

## 2011-03-26 NOTE — Consult Note (Signed)
[facility]  Patient:    Debra Chen, Debra Chen Visit Number: [id number] MRN: [id number]          Service Type: GON Location: GYN Attending Physician:  Jeannette Corpus Dictated by:   Rande Brunt. Clarke-Pearson, M.D. Proc. Date: 10/04/01 Admit Date:  10/04/2001 Discharge Date: 10/04/2001   CC:         Laqueta Linden, M.D.  Telford Nab, R.N.   Consultation Report  A 77 year old white female referred by Dr. Myrlene Broker for consultation regarding management of an apparent Bartholins abscess.  I reviewed records from Dr. Lonn Georgia office and it appears the patient had a vulvar abscess initially treated in their office in August 2001.  This was incised and drained at that time and treated with antibiotics and had good resolution. Apparently, she had another flare of this abscess in August 2002, again being managed with warm soaks and antibiotics.  Her most recent episode was in October 2002.  Currently, the patient is entirely asymptomatic.  This abscess area has resolved spontaneously.  The patient has no other gynecologic history.  She denies any fevers, chills, pain, or masses.  PHYSICAL EXAMINATION  VITAL SIGNS:  Weight 218 pounds, height 5 feet 3 inches, blood pressure 140/82, pulse 68, respiratory rate 16.  GENERAL:  The patient is a pleasant, elderly white female in no acute distress.  ABDOMEN:  Soft, nontender.  No mass, organomegaly, ascites, or hernias are noted.  Specifically, there is no inguinal adenopathy or lesions.  PELVIC:  EGBUS is essentially normal.  There is a small sinus tract in the area of the Bartholins gland region.  Inspection and palpation of this area reveals no masses or nodularity, tenderness, or any purulence.  Vagina is clean.  Cervix is normal.  Uterus is anterior, normal shape, size, consistency.  No adnexal masses are noted.  IMPRESSION:  Recurrent vulvar abscess.  While one would be concerned regarding the  possibility of a neoplasm, I do not feel any masses or nodularity or anything else that makes me suspect a neoplastic process.  If this continues to reoccur, then I think wide excision of this region would be most appropriate.  At the present time it is entirely quiescent and therefore would suggest observation until another reoccurrence.  The patient will return to the care of Dr. Myrlene Broker but I will be happy to see her again in the future if she has a reoccurrence. Dictated by:   Rande Brunt. Clarke-Pearson, M.D. Attending Physician:  Jeannette Corpus DD:  10/10/01 TD:  10/10/01 Job: [id number]

## 2011-06-11 ENCOUNTER — Encounter: Payer: Self-pay | Admitting: Cardiology

## 2012-03-06 ENCOUNTER — Encounter: Payer: Self-pay | Admitting: Cardiology

## 2012-04-14 ENCOUNTER — Telehealth: Payer: Self-pay | Admitting: Cardiology

## 2012-04-14 NOTE — Telephone Encounter (Signed)
PT HAVING SOB, FEELS "HAZY" , PLS CALL

## 2012-04-14 NOTE — Telephone Encounter (Signed)
On Wednesday pt noticed she was swelling in her legs and having some shortness of breath.  She saw her PCP who started her on Lasix however she was not able to start it until Thursday night.  She has now had two doses but doesn't feel like her swelling is much better.  Advised pt to call PCP back to let them know since they started the treatment and have recent labs on her. (kidney function-K+, etc) her BP is 128/60 per her report.  She will call her PCP for further orders and will follow up as scheduled 7/3 with Dr Antoine Poche

## 2012-05-04 ENCOUNTER — Telehealth: Payer: Self-pay | Admitting: Cardiology

## 2012-05-04 NOTE — Telephone Encounter (Signed)
Left message to call back  

## 2012-05-04 NOTE — Telephone Encounter (Signed)
New Problem:    Patient called in because she has been having issues and was wondering if she needed to come in earlier.  Please call back

## 2012-05-05 NOTE — Telephone Encounter (Signed)
Spoke with pt who states she had been having palpitations and rapid HR - she felt as though she would "black out" at times. She saw Dr Foy Guadalajara who stopped her Amitriptyline.  She has been off for 2 days now and feels some better.  She will keep appointment as scheduled in [place] on Wednesday.  She will call down if s/s worsen prior to then.

## 2012-05-05 NOTE — Telephone Encounter (Signed)
Lm to call back

## 2012-05-10 ENCOUNTER — Ambulatory Visit (INDEPENDENT_AMBULATORY_CARE_PROVIDER_SITE_OTHER): Payer: Medicare Other | Admitting: Cardiology

## 2012-05-10 ENCOUNTER — Encounter: Payer: Self-pay | Admitting: Cardiology

## 2012-05-10 VITALS — BP 140/60 | HR 47 | Ht 62.0 in | Wt 156.0 lb

## 2012-05-10 DIAGNOSIS — R001 Bradycardia, unspecified: Secondary | ICD-10-CM | POA: Insufficient documentation

## 2012-05-10 DIAGNOSIS — I719 Aortic aneurysm of unspecified site, without rupture: Secondary | ICD-10-CM

## 2012-05-10 DIAGNOSIS — I498 Other specified cardiac arrhythmias: Secondary | ICD-10-CM

## 2012-05-10 DIAGNOSIS — I080 Rheumatic disorders of both mitral and aortic valves: Secondary | ICD-10-CM

## 2012-05-10 DIAGNOSIS — I4891 Unspecified atrial fibrillation: Secondary | ICD-10-CM

## 2012-05-10 NOTE — Progress Notes (Signed)
HPI The patient presents for followup of atrial fibrillation and mitral regurgitation.  She called last week to report symptoms of feeling dizzy. She has been treated recently with amitriptyline for neuropathy. Her primary provider stop this and over the past daily she has felt much better. When she was having her dizziness she was having more tachypalpitations that since she has had before. She thinks this is her fibrillation. However, it was more sustained. She did not have any frank syncope. She hasn't had any chest pressure, neck or arm discomfort. She hasn't had any shortness of breath, PND or orthopnea. She's had no weight gain or edema. She actually feels much better the last couple of days then she has in the last few weeks.  Allergies  Allergen Reactions  . Sulfonamide Derivatives     Current Outpatient Prescriptions  Medication Sig Dispense Refill  . atorvastatin (LIPITOR) 20 MG tablet Take 20 mg by mouth daily.      . benazepril-hydrochlorthiazide (LOTENSIN HCT) 20-12.5 MG per tablet Take 1 tablet by mouth daily.        . Calcium Carbonate (CALCIUM 600) 1500 MG TABS Take 1 tablet by mouth daily.        . digoxin (LANOXIN) 0.125 MG tablet Take 125 mcg by mouth daily.        . diphenhydrAMINE (SOMINEX) 25 MG tablet Take 25 mg by mouth at bedtime.        Marland Kitchen HYDROcodone-acetaminophen (VICODIN) 5-500 MG per tablet Take 1 tablet by mouth every 6 (six) hours as needed.        . NON FORMULARY OTC loratidine - daily       . Rivaroxaban (XARELTO) 15 MG TABS tablet Take 15 mg by mouth daily.      Marland Kitchen VITAMIN D, ERGOCALCIFEROL, PO Take 120 mg by mouth daily.        Past Medical History  Diagnosis Date  . HTN (hypertension)     x 20 years  . PAF (paroxysmal atrial fibrillation)   . MR (mitral regurgitation)     mod  . TR (tricuspid regurgitation)     mod     Past Surgical History  Procedure Date  . Tubal ligation     ROS:  As stated in the HPI and negative for all other  systems.  PHYSICAL EXAM BP 140/60  Pulse 47  Ht 5\' 2"  (1.575 m)  Wt 156 lb (70.761 kg)  BMI 28.53 kg/m2 GENERAL:  Well appearing HEENT:  Pupils equal round and reactive, fundi not visualized, oral mucosa unremarkable NECK:  No jugular venous distention, waveform within normal limits, carotid upstroke brisk and symmetric, no bruits, no thyromegaly LYMPHATICS:  No cervical, inguinal adenopathy LUNGS:  Clear to auscultation bilaterally BACK:  No CVA tenderness CHEST:  Unremarkable HEART:  PMI not displaced or sustained,S1 and S2 within normal limits, no S3, no S4, no clicks, no rubs, apical murmur soft and radiating slightly out the aortic outflow tract, no diastolic murmurs.  ABD:  Flat, positive bowel sounds normal in frequency in pitch, no bruits, no rebound, no guarding, no midline pulsatile mass, no hepatomegaly, no splenomegaly EXT:  2 plus pulses throughout, no edema, no cyanosis no clubbing SKIN:  No rashes no nodules NEURO:  Cranial nerves II through XII grossly intact, motor grossly intact throughout PSYCH:  Cognitively intact, oriented to person place and time   EKG:  Sinus bradycardia, rate 47, axis within normal limits, intervals within normal limits, nonspecific ST depression with LVH  voltage criteria. 05/10/2012   ASSESSMENT AND PLAN

## 2012-05-10 NOTE — Assessment & Plan Note (Signed)
It sounds like the patient has been having some paroxysms but these are much improved off of amitriptyline. She says she's not particularly bothered by the short paroxysms she's had chronically. She tolerates anticoagulation. I will have her stop digoxin as below.

## 2012-05-10 NOTE — Assessment & Plan Note (Signed)
She does occasionally feel fatigued with her low heart rate. I was not and digoxin. If she continues to have this issue we will discuss further and I will probably follow with the monitor.

## 2012-05-10 NOTE — Assessment & Plan Note (Signed)
She had a small aneurysm when last checked in 2008 and a repeat an ultrasound.

## 2012-05-10 NOTE — Assessment & Plan Note (Signed)
I reviewed the echo from 2011. At this point she's not changed clinically and I don't think further imaging is indicated.

## 2012-05-10 NOTE — Patient Instructions (Addendum)
Please stop Digoxin Continue all other medications as listed  Your physician has requested that you have an abdominal aorta duplex. During this test, an ultrasound is used to evaluate the aorta. Allow 30 minutes for this exam. Do not eat after midnight the day before and avoid carbonated beverages  Follow up in 1 year with Dr Antoine Poche.  You will receive a letter in the mail 2 months before you are due.  Please call us when you receive this letter to schedule your follow up appointment.

## 2012-05-19 ENCOUNTER — Encounter (INDEPENDENT_AMBULATORY_CARE_PROVIDER_SITE_OTHER): Payer: Medicare Other

## 2012-05-19 DIAGNOSIS — I700 Atherosclerosis of aorta: Secondary | ICD-10-CM

## 2012-05-19 DIAGNOSIS — I714 Abdominal aortic aneurysm, without rupture, unspecified: Secondary | ICD-10-CM

## 2012-05-19 DIAGNOSIS — I719 Aortic aneurysm of unspecified site, without rupture: Secondary | ICD-10-CM

## 2013-08-22 ENCOUNTER — Telehealth: Payer: Self-pay | Admitting: Cardiology

## 2013-08-22 ENCOUNTER — Encounter: Payer: Self-pay | Admitting: Cardiology

## 2013-08-22 NOTE — Telephone Encounter (Signed)
Pt states she is calling to make an appointment with Dr. Antoine Poche in [place]. Pt is made aware that Dr. Antoine Poche will be in [place] on 08/29/13. And the scheduler will call her to make the appointment. Pt states last night she did not feel to good, her heart has been out of rhythm for sometime and Dr Antoine Poche is aware of that, But she thinks she did too much yesterday, she will rest today. Pt is aware to call the office if needed. Scheduler is aware to call pt and make the appointment.

## 2013-08-22 NOTE — Telephone Encounter (Signed)
New problem  Patient feels like her heart is beating out of rhythm. SOB

## 2013-08-23 ENCOUNTER — Telehealth: Payer: Self-pay | Admitting: Cardiology

## 2013-08-23 NOTE — Telephone Encounter (Signed)
Returned call to patient she stated she saw her PCP yesterday 08/22/13 and she prescribed Metoprolol 25 mg 1/2 tablet daily.Stated she has not had filled.Stated she wanted to check with Dr.Hochrein before she starts taking.Stated she also has appointment with Norma Fredrickson NP 08/27/13.Stated she wants to see Dr.Hochrein.Message sent to Dr.Hochrein.

## 2013-08-23 NOTE — Telephone Encounter (Signed)
Patient is very concerned that PCP prescribed metoprolol. She is scheduled to see Lawson Fiscal on Monday.

## 2013-08-24 NOTE — Telephone Encounter (Signed)
F/up  Please call on the cell number provided.

## 2013-08-24 NOTE — Telephone Encounter (Signed)
Follow Up:  Pt states she is returning Pam's call.

## 2013-08-24 NOTE — Telephone Encounter (Signed)
NA at mobile number - left message on home vm - OK to start however she may hold off until seeing Lawson Fiscal on Monday.

## 2013-08-24 NOTE — Telephone Encounter (Signed)
Follow up    Pt retuning your call

## 2013-08-27 ENCOUNTER — Ambulatory Visit (INDEPENDENT_AMBULATORY_CARE_PROVIDER_SITE_OTHER): Payer: Medicare Other | Admitting: Nurse Practitioner

## 2013-08-27 ENCOUNTER — Encounter: Payer: Self-pay | Admitting: Nurse Practitioner

## 2013-08-27 ENCOUNTER — Encounter: Payer: Self-pay | Admitting: Cardiology

## 2013-08-27 VITALS — BP 120/70 | HR 82 | Ht 63.0 in | Wt 155.4 lb

## 2013-08-27 DIAGNOSIS — I4891 Unspecified atrial fibrillation: Secondary | ICD-10-CM

## 2013-08-27 DIAGNOSIS — I498 Other specified cardiac arrhythmias: Secondary | ICD-10-CM

## 2013-08-27 DIAGNOSIS — R001 Bradycardia, unspecified: Secondary | ICD-10-CM

## 2013-08-27 NOTE — Patient Instructions (Signed)
Stay on your current medicines per the list you are given today  See Dr. Antoine Poche next month as planned  Let us know if you have any heart racings, lightheadedness, dizziness, etc.  Call the [facility] office at [phone] if you have any questions, problems or concerns.

## 2013-08-27 NOTE — Progress Notes (Signed)
Debra Chen Date of Birth: September 07, 1926 Medical Record #[id number]  History of Present Illness: [name] is seen back today for a work in visit. Seen for Dr. Antoine Poche. She has not been here in over a year. She has PAF and MR. Other issues include advanced age, HTN, HLD, and AAA.   Last seen here in July of 2013 - digoxin was to be stopped due to bradycardia - she was in sinus at that time. She remains on Xarelto.   Comes back today. Here alone. Sees Dr. Antoine Poche next month. She really has no complaint. Went dancing this past weekend. Some occasional hard heart beating and feels like she is out of rhythm most of the time. Not dizzy or lightheaded. No syncope. Remains very active. No chest pain. Not short of breath. Says she is here because she tried to see Dr. Foy Guadalajara but saw someone else in his place - she was there for some persistent diarrhea (that has now stopped) but apparently was in atrial fib - sounds like her rate was up some - Toprol was started - but she did not take. Says she was too scared to take. Has never stopped her digoxin.    Current Outpatient Prescriptions  Medication Sig Dispense Refill  . atorvastatin (LIPITOR) 20 MG tablet Take 20 mg by mouth daily.      . benazepril-hydrochlorthiazide (LOTENSIN HCT) 20-12.5 MG per tablet Take 1 tablet by mouth daily.        . Calcium Carbonate (CALCIUM 600) 1500 MG TABS Take 1 tablet by mouth daily.        Marland Kitchen DIGOX 125 MCG tablet Take 0.125 mg by mouth daily.       . furosemide (LASIX) 20 MG tablet Take 20 mg by mouth as needed.      Marland Kitchen HYDROcodone-acetaminophen (VICODIN) 5-500 MG per tablet Take 1 tablet by mouth every 6 (six) hours as needed.        . NON FORMULARY OTC loratidine - daily       . Rivaroxaban (XARELTO) 15 MG TABS tablet Take 15 mg by mouth daily.      Marland Kitchen VITAMIN D, ERGOCALCIFEROL, PO Take 120 mg by mouth daily.       No current facility-administered medications for this visit.    Allergies  Allergen Reactions    . Sulfonamide Derivatives     Past Medical History  Diagnosis Date  . HTN (hypertension)     x 20 years  . PAF (paroxysmal atrial fibrillation)   . MR (mitral regurgitation)     mod  . TR (tricuspid regurgitation)     mod     Past Surgical History  Procedure Laterality Date  . Tubal ligation      History  Smoking status  . Former Smoker  . Quit date: 05/10/1972  Smokeless tobacco  . Not on file    Comment: smoked 1 ppd for 20 years; quit in 1989    History  Alcohol Use: Not on file    No family history on file.  Review of Systems: The review of systems is per the HPI.  All other systems were reviewed and are negative.  Physical Exam: BP 120/70  Pulse 82  Ht 5\' 3"  (1.6 m)  Wt 155 lb 6.4 oz (70.489 kg)  BMI 27.53 kg/m2 Patient is very pleasant and just delightful and in no acute distress. Looks younger than her stated age. Skin is warm and dry. Color is normal.  HEENT is  unremarkable. Normocephalic/atraumatic. PERRL. Sclera are nonicteric. Neck is supple. No masses. No JVD. Lungs are clear. Cardiac exam shows an irregular rhythm. Rate is controlled today. Abdomen is soft. Extremities are without edema. Gait and ROM are intact. No gross neurologic deficits noted.  LABORATORY DATA: EKG today shows atrial fib with a controlled VR of 82 with diffuse ST/T wave changes.   Lab Results  Component Value Date   INR 2.4 08/12/2009     Assessment / Plan:  1. PAF - probably now chronic - she is on anticoagulation - her rate is controlled - she is totally asymptomatic - I have left her on her usual regimen - she will not start the metoprolol at this time. See Dr. Antoine Poche back as planned for her regular follow up.   2. HTN - BP looks good.   3. Diarrhea - now resolved.   Patient is agreeable to this plan and will call if any problems develop in the interim.   Rosalio Macadamia, RN, ANP-C [facility] [address] [place], [place]  [address]

## 2013-09-19 ENCOUNTER — Encounter: Payer: Self-pay | Admitting: Cardiology

## 2013-09-19 ENCOUNTER — Ambulatory Visit (INDEPENDENT_AMBULATORY_CARE_PROVIDER_SITE_OTHER): Payer: Medicare Other | Admitting: Cardiology

## 2013-09-19 VITALS — BP 122/71 | HR 79 | Ht 63.0 in | Wt 157.0 lb

## 2013-09-19 DIAGNOSIS — I498 Other specified cardiac arrhythmias: Secondary | ICD-10-CM

## 2013-09-19 DIAGNOSIS — R001 Bradycardia, unspecified: Secondary | ICD-10-CM

## 2013-09-19 DIAGNOSIS — I4891 Unspecified atrial fibrillation: Secondary | ICD-10-CM

## 2013-09-19 NOTE — Patient Instructions (Signed)
The current medical regimen is effective;  continue present plan and medications.  Your physician has recommended that you wear a holter monitor. Holter monitors are medical devices that record the heart's electrical activity. Doctors most often use these monitors to diagnose arrhythmias. Arrhythmias are problems with the speed or rhythm of the heartbeat. The monitor is a small, portable device. You can wear one while you do your normal daily activities. This is usually used to diagnose what is causing palpitations/syncope (passing out).  Follow up with Dr Antoine Poche in 2 months.

## 2013-09-19 NOTE — Progress Notes (Signed)
HPI The patient presents for followup of atrial fibrillation and mitral regurgitation.  Since I last saw her she was seen in her primary care office. She did have atrial fibrillation with apparently a rapid rate. There was apparently a prescription for metoprolol. She did not yet start this. She was seen in our office and apparently her rate was okay. She has been dizzy but this is described in my previous notes as well. The dizziness previously was while she was in normal sinus rhythm. She now seems to be more persistent atrial fibrillation. She may or may not feel palpitations at times. The dizziness she wonders if it could be some inner ear problem. She's not describing presyncope or syncope. She's not describing any new shortness of breath, PND or orthopnea. She does have stress at home and she internalizes this and has anxiety. She has been fatigued but she's not sleeping well.  Allergies  Allergen Reactions  . Sulfonamide Derivatives     Current Outpatient Prescriptions  Medication Sig Dispense Refill  . atorvastatin (LIPITOR) 20 MG tablet Take 20 mg by mouth daily.      . benazepril-hydrochlorthiazide (LOTENSIN HCT) 20-12.5 MG per tablet Take 1 tablet by mouth daily.        . Calcium Carbonate (CALCIUM 600) 1500 MG TABS Take 1 tablet by mouth daily.        Marland Kitchen DIGOX 125 MCG tablet Take 0.125 mg by mouth daily.       . furosemide (LASIX) 20 MG tablet Take 20 mg by mouth as needed.      Marland Kitchen HYDROcodone-acetaminophen (VICODIN) 5-500 MG per tablet Take 1 tablet by mouth every 6 (six) hours as needed.        . Rivaroxaban (XARELTO) 15 MG TABS tablet Take 15 mg by mouth daily.      Marland Kitchen VITAMIN D, ERGOCALCIFEROL, PO Take 120 mg by mouth daily.       No current facility-administered medications for this visit.    Past Medical History  Diagnosis Date  . HTN (hypertension)     x 20 years  . PAF (paroxysmal atrial fibrillation)   . MR (mitral regurgitation)     mod  . TR (tricuspid  regurgitation)     mod     Past Surgical History  Procedure Laterality Date  . Tubal ligation      ROS:  Back pain, insomnia, anxiety.  Otherwise as stated in the HPI and negative for all other systems.  PHYSICAL EXAM BP 122/71  Pulse 79  Ht 5\' 3"  (1.6 m)  Wt 157 lb (71.215 kg)  BMI 27.82 kg/m2 GENERAL:  Well appearing HEENT:  Pupils equal round and reactive, fundi not visualized, oral mucosa unremarkable NECK:  No jugular venous distention, waveform within normal limits, carotid upstroke brisk and symmetric, no bruits, no thyromegaly LUNGS:  Clear to auscultation bilaterally BACK:  No CVA tenderness CHEST:  Unremarkable HEART:  PMI not displaced or sustained,S1 and S2 within normal limits, no S3,  no clicks, no rubs, apical murmur soft and radiating slightly out the aortic outflow tract, no diastolic murmurs, irregular ABD:  Flat, positive bowel sounds normal in frequency in pitch, no bruits, no rebound, no guarding, no midline pulsatile mass, no hepatomegaly, no splenomegaly EXT:  2 plus pulses throughout, no edema, no cyanosis no clubbing   EKG:  Atrial fibrillation, rate 74, axis within normal limits, intervals within no acute ST-T wave changes.  09/19/2013   ASSESSMENT AND PLAN  DIZZINESS:  Is not clear the etiology of her dizziness. This could be multifactorial. I am going to start an evaluation as below.  ATRIAL FIBRILLATION:  I'm going to check a 24-hour Holter to see if she has reasonable rate control, see if this is indeed persistent over the 24 hours and to make sure there are no bradycardia arrhythmias. I talked with her and her daughter about the possibilities of rhythm control versus rate control. For now we are going to pursue anticoagulation and rate control as she is not convinced she would want to go through further therapies cardioversions for rhythm management.

## 2013-09-21 ENCOUNTER — Encounter: Payer: Self-pay | Admitting: Cardiovascular Disease

## 2013-10-10 ENCOUNTER — Telehealth: Payer: Self-pay | Admitting: Cardiology

## 2013-10-10 NOTE — Telephone Encounter (Signed)
Called agency/ April and asked if cardiology ordered the back brace. The order was sent by mistake and they will send to Dr Nelda Severe pcp.

## 2013-10-10 NOTE — Telephone Encounter (Signed)
New Message  April with [facility] called she faxed an order for a back brace on 12/2.Marland Kitchen Has not received a response.Marland Kitchen Please return completed fax at your earliest convenience.

## 2013-10-19 ENCOUNTER — Telehealth: Payer: Self-pay | Admitting: Cardiology

## 2013-10-19 NOTE — Telephone Encounter (Signed)
Patient was schedule to have monitor on 10-15-13.  Couldn't keep the appointment because she was sick. Was seen by PCP who wanted to change her Digox per the lab work.  Want to discuss this and question if she still need to do the monitor. Want you to get the lab report for [facility] (Dr. Nelda Severe).

## 2013-10-19 NOTE — Telephone Encounter (Signed)
Left message for pt that she does still need the monitor placed and she should call back to have that scheduled.

## 2013-10-26 ENCOUNTER — Telehealth: Payer: Self-pay | Admitting: Cardiology

## 2013-10-26 NOTE — Telephone Encounter (Signed)
Follow up      Pt returning your call about the heart monitor and some other things.  Pt would like a call back please.

## 2013-10-26 NOTE — Telephone Encounter (Signed)
Pt states she will call back to reschedule monitor.  States PCP wanted her to stop her Digoxin but she doesn't think she should.  Advised before Dr Antoine Poche will make decisions about stopping any medication he will want her to have the monitor first.  She states understanding.

## 2013-11-28 ENCOUNTER — Encounter: Payer: Self-pay | Admitting: Cardiology

## 2013-11-28 ENCOUNTER — Ambulatory Visit (INDEPENDENT_AMBULATORY_CARE_PROVIDER_SITE_OTHER): Payer: Medicare Other | Admitting: Cardiology

## 2013-11-28 VITALS — BP 128/80 | HR 69 | Ht 63.0 in | Wt 158.0 lb

## 2013-11-28 DIAGNOSIS — I080 Rheumatic disorders of both mitral and aortic valves: Secondary | ICD-10-CM

## 2013-11-28 DIAGNOSIS — I10 Essential (primary) hypertension: Secondary | ICD-10-CM

## 2013-11-28 DIAGNOSIS — I4891 Unspecified atrial fibrillation: Secondary | ICD-10-CM

## 2013-11-28 MED ORDER — APIXABAN 2.5 MG PO TABS: 2.5000 mg | ORAL_TABLET | Freq: Two times a day (BID) | ORAL | Status: DC

## 2013-11-28 NOTE — Progress Notes (Signed)
  HPI The patient presents for followup of atrial fibrillation and mitral regurgitation.  She was in fibrillation at the end of last year and was going to wear a monitor. However, she begged off of doing this and actually was back in sinus rhythm.  She doesn't think she's had any paroxysms in the last several weeks.  The patient denies any new symptoms such as chest discomfort, neck or arm discomfort. There has been no new shortness of breath, PND or orthopnea. There have been no reported palpitations, presyncope or syncope.  Of note she's having trouble affording her Xarelto.    Allergies  Allergen Reactions  . Sulfonamide Derivatives     Current Outpatient Prescriptions  Medication Sig Dispense Refill  . atorvastatin (LIPITOR) 20 MG tablet Take 20 mg by mouth daily.      . benazepril-hydrochlorthiazide (LOTENSIN HCT) 20-12.5 MG per tablet Take 1 tablet by mouth daily.        . Calcium Carbonate (CALCIUM 600) 1500 MG TABS Take 1 tablet by mouth daily.        Marland Kitchen DIGOX 125 MCG tablet Take 0.125 mg by mouth daily.       . furosemide (LASIX) 20 MG tablet Take 20 mg by mouth as needed.      Marland Kitchen HYDROcodone-acetaminophen (VICODIN) 5-500 MG per tablet Take 1 tablet by mouth every 6 (six) hours as needed.        . Rivaroxaban (XARELTO) 15 MG TABS tablet Take 15 mg by mouth daily.       No current facility-administered medications for this visit.    Past Medical History  Diagnosis Date  . HTN (hypertension)     x 20 years  . PAF (paroxysmal atrial fibrillation)   . MR (mitral regurgitation)     mod  . TR (tricuspid regurgitation)     mod     Past Surgical History  Procedure Laterality Date  . Tubal ligation      ROS:  As stated in the HPI and negative for all other systems.  PHYSICAL EXAM BP 128/80  Pulse 69  Ht 5\' 3"  (1.6 m)  Wt 158 lb (71.668 kg)  BMI 28.00 kg/m2 GENERAL:  Well appearing HEENT:  Pupils equal round and reactive, fundi not visualized, oral mucosa unremarkable,  dentures NECK:  No jugular venous distention, waveform within normal limits, carotid upstroke brisk and symmetric, no bruits, no thyromegaly LUNGS:  Clear to auscultation bilaterally BACK:  No CVA tenderness CHEST:  Unremarkable HEART:  PMI not displaced or sustained,S1 and S2 within normal limits, no S3,  no clicks, no rubs, apical murmur soft and radiating slightly out the aortic outflow tract, no diastolic murmurs, irregular ABD:  Flat, positive bowel sounds normal in frequency in pitch, no bruits, no rebound, no guarding, no midline pulsatile mass, no hepatomegaly, no splenomegaly EXT:  2 plus pulses throughout, no edema, no cyanosis no clubbing   ASSESSMENT AND PLAN  DIZZINESS:  This is not as much of a complaint. No change in therapy is indicated.  ATRIAL FIBRILLATION:  I am going to try to switch her to Eliquis although I don't think this will be more affordable. It turns out she can't take this she probably will go back to warfarin.

## 2013-11-28 NOTE — Patient Instructions (Signed)
Please check into the cost of Eliquis 2.5 mg one twice a day. Continue Xarelto until you decide if you are going to change. Continue all other medications as listed.  Follow up in 4 months with Dr Percival Spanish

## 2014-01-24 ENCOUNTER — Encounter: Payer: Self-pay | Admitting: Cardiology

## 2014-04-03 ENCOUNTER — Ambulatory Visit: Payer: Medicare Other | Admitting: Cardiology

## 2014-05-22 ENCOUNTER — Encounter: Payer: Self-pay | Admitting: Cardiology

## 2014-05-22 ENCOUNTER — Ambulatory Visit (INDEPENDENT_AMBULATORY_CARE_PROVIDER_SITE_OTHER): Payer: Medicare Other | Admitting: Cardiology

## 2014-05-22 VITALS — BP 115/59 | HR 65 | Ht 63.5 in | Wt 152.0 lb

## 2014-05-22 DIAGNOSIS — I719 Aortic aneurysm of unspecified site, without rupture: Secondary | ICD-10-CM

## 2014-05-22 DIAGNOSIS — I080 Rheumatic disorders of both mitral and aortic valves: Secondary | ICD-10-CM

## 2014-05-22 DIAGNOSIS — I4819 Other persistent atrial fibrillation: Secondary | ICD-10-CM

## 2014-05-22 DIAGNOSIS — I10 Essential (primary) hypertension: Secondary | ICD-10-CM

## 2014-05-22 DIAGNOSIS — I4891 Unspecified atrial fibrillation: Secondary | ICD-10-CM

## 2014-05-22 NOTE — Patient Instructions (Signed)
The current medical regimen is effective;  continue present plan and medications.  Your physician has requested that you have an abdominal aorta duplex. During this test, an ultrasound is used to evaluate the aorta. Allow 30 minutes for this exam. Do not eat after midnight the day before and avoid carbonated beverages.  Please have a CBC at your primary care Drs office with the results faxed to Dr Percival Spanish.  Follow up in 1 year with Dr Percival Spanish.  You will receive a letter in the mail 2 months before you are due.  Please call us when you receive this letter to schedule your follow up appointment.

## 2014-05-22 NOTE — Progress Notes (Signed)
HPI The patient presents for followup of atrial fibrillation and mitral regurgitation.  She has had a problem with nosebleeds. The last of these was about 3 months ago when she had at cauterization. There was consideration at that time with her physicians to switching her to a different anticoagulant. However, this was not done. She's had no further problems since then. She actually thinks she feels relatively well. She no palpitations occasionally at night. However, she doesn't have any presyncope or syncope. She has no chest pressure, neck or arm discomfort. She has no weight gain or edema. She does some household chores that she is limited by joint pain.   Allergies  Allergen Reactions  . Sulfonamide Derivatives     Current Outpatient Prescriptions  Medication Sig Dispense Refill  . atorvastatin (LIPITOR) 20 MG tablet Take 20 mg by mouth daily.      . benazepril-hydrochlorthiazide (LOTENSIN HCT) 20-12.5 MG per tablet Take 1 tablet by mouth daily.        . Calcium Carbonate (CALCIUM 600) 1500 MG TABS Take 1 tablet by mouth daily.        Marland Kitchen DIGOX 125 MCG tablet Take 0.125 mg by mouth daily.       . furosemide (LASIX) 20 MG tablet Take 20 mg by mouth as needed.      Alveda Reasons 15 MG TABS tablet 15 mg at bedtime.       Marland Kitchen HYDROcodone-acetaminophen (VICODIN) 5-500 MG per tablet Take 1 tablet by mouth every 6 (six) hours as needed.         No current facility-administered medications for this visit.    Past Medical History  Diagnosis Date  . HTN (hypertension)     x 20 years  . PAF (paroxysmal atrial fibrillation)   . MR (mitral regurgitation)     mod  . TR (tricuspid regurgitation)     mod     Past Surgical History  Procedure Laterality Date  . Tubal ligation      ROS:  Neuropathy.  Otherwise as stated in the HPI and negative for all other systems.  PHYSICAL EXAM BP 115/59  Pulse 65  Ht 5' 3.5" (1.613 m)  Wt 152 lb (68.947 kg)  BMI 26.50 kg/m2 GENERAL:  Well  appearing HEENT:  Pupils equal round and reactive, fundi not visualized, oral mucosa unremarkable, dentures NECK:  No jugular venous distention, waveform within normal limits, carotid upstroke brisk and symmetric, no bruits, no thyromegaly LUNGS:  Clear to auscultation bilaterally BACK:  No CVA tenderness CHEST:  Unremarkable HEART:  PMI not displaced or sustained,S1 and S2 within normal limits, no S3,  no clicks, no rubs, apical murmur soft and radiating slightly out the aortic outflow tract, no diastolic murmurs, irregular ABD:  Flat, positive bowel sounds normal in frequency in pitch, no bruits, no rebound, no guarding, no midline pulsatile mass, no hepatomegaly, no splenomegaly EXT:  2 plus pulses throughout, mild edema, no cyanosis no clubbing  EKG:  Atrial fibrillation, rate 53, axis within normal limits, intervals within normal limits, no acute ST-T wave changes. 05/22/2014  ASSESSMENT AND PLAN  DIZZINESS:  This is rare.  No further work up is indicated.    ATRIAL FIBRILLATION:   Despite the nosebleeds I think she is tolerating anticoagulation hasn't been several months. We talked about possibly switching but for now I think she's on the correct dose of Xarelto and it is easier for her to take so she will continue with this.  I did review  outside labs.  I don't see a recent CBC so I will order one of these. Of note we have chosen to use the lower dose of Xarelto because of fluctuating creat in the past.   AAA:  This is 2.3 x 2.4 cm in 2013 into your followup was suggested. I will arrange this.  MR:  I would not suspect symptomatically that this is severe. Given her advanced age we will follow this clinically and no further imaging is indicated.

## 2014-07-12 ENCOUNTER — Other Ambulatory Visit (HOSPITAL_COMMUNITY): Payer: Self-pay | Admitting: *Deleted

## 2014-07-12 DIAGNOSIS — I714 Abdominal aortic aneurysm, without rupture, unspecified: Secondary | ICD-10-CM

## 2014-07-26 ENCOUNTER — Other Ambulatory Visit (HOSPITAL_COMMUNITY): Payer: Self-pay | Admitting: *Deleted

## 2014-07-26 DIAGNOSIS — I714 Abdominal aortic aneurysm, without rupture, unspecified: Secondary | ICD-10-CM

## 2014-08-01 ENCOUNTER — Encounter (HOSPITAL_COMMUNITY): Payer: Medicare Other

## 2014-08-05 ENCOUNTER — Ambulatory Visit (HOSPITAL_COMMUNITY): Payer: Medicare Other | Attending: Internal Medicine | Admitting: Cardiology

## 2014-08-05 DIAGNOSIS — I714 Abdominal aortic aneurysm, without rupture, unspecified: Secondary | ICD-10-CM | POA: Insufficient documentation

## 2014-08-05 DIAGNOSIS — I10 Essential (primary) hypertension: Secondary | ICD-10-CM | POA: Insufficient documentation

## 2014-08-05 DIAGNOSIS — I700 Atherosclerosis of aorta: Secondary | ICD-10-CM

## 2014-08-05 DIAGNOSIS — Z87891 Personal history of nicotine dependence: Secondary | ICD-10-CM | POA: Insufficient documentation

## 2014-08-05 NOTE — Progress Notes (Signed)
Aorto-iliac duplex performed 

## 2014-08-12 ENCOUNTER — Telehealth: Payer: Self-pay | Admitting: Cardiology

## 2014-08-12 ENCOUNTER — Other Ambulatory Visit: Payer: Self-pay | Admitting: *Deleted

## 2014-08-12 DIAGNOSIS — I714 Abdominal aortic aneurysm, without rupture, unspecified: Secondary | ICD-10-CM

## 2014-08-12 NOTE — Telephone Encounter (Signed)
New message ° ° ° ° ° ° ° ° °Pt calling for results °

## 2014-08-14 NOTE — Telephone Encounter (Signed)
Follow up     Pt want her test results

## 2014-08-22 ENCOUNTER — Telehealth: Payer: Self-pay | Admitting: Cardiology

## 2014-08-22 NOTE — Telephone Encounter (Signed)
Pt. Called and informed about her results

## 2014-08-22 NOTE — Telephone Encounter (Signed)
Will forward to [place] nurse with Dr Percival Spanish

## 2014-08-22 NOTE — Telephone Encounter (Signed)
Pt to have abd. Aorta test repeated in 2 years

## 2014-08-22 NOTE — Telephone Encounter (Signed)
New message    Calling for test results  

## 2015-11-27 DIAGNOSIS — N183 Chronic kidney disease, stage 3 (moderate): Secondary | ICD-10-CM | POA: Diagnosis not present

## 2015-11-27 DIAGNOSIS — I340 Nonrheumatic mitral (valve) insufficiency: Secondary | ICD-10-CM | POA: Diagnosis not present

## 2015-11-27 DIAGNOSIS — E1142 Type 2 diabetes mellitus with diabetic polyneuropathy: Secondary | ICD-10-CM | POA: Diagnosis not present

## 2015-11-27 DIAGNOSIS — I10 Essential (primary) hypertension: Secondary | ICD-10-CM | POA: Diagnosis not present

## 2015-11-27 DIAGNOSIS — E1122 Type 2 diabetes mellitus with diabetic chronic kidney disease: Secondary | ICD-10-CM | POA: Diagnosis not present

## 2015-11-27 DIAGNOSIS — M170 Bilateral primary osteoarthritis of knee: Secondary | ICD-10-CM | POA: Diagnosis not present

## 2015-11-27 DIAGNOSIS — I482 Chronic atrial fibrillation: Secondary | ICD-10-CM | POA: Diagnosis not present

## 2015-11-27 DIAGNOSIS — E7800 Pure hypercholesterolemia, unspecified: Secondary | ICD-10-CM | POA: Diagnosis not present

## 2015-11-27 DIAGNOSIS — M810 Age-related osteoporosis without current pathological fracture: Secondary | ICD-10-CM | POA: Diagnosis not present

## 2015-12-22 DIAGNOSIS — H04123 Dry eye syndrome of bilateral lacrimal glands: Secondary | ICD-10-CM | POA: Diagnosis not present

## 2015-12-22 DIAGNOSIS — E119 Type 2 diabetes mellitus without complications: Secondary | ICD-10-CM | POA: Diagnosis not present

## 2015-12-30 DIAGNOSIS — M25572 Pain in left ankle and joints of left foot: Secondary | ICD-10-CM | POA: Diagnosis not present

## 2016-01-22 DIAGNOSIS — Z87891 Personal history of nicotine dependence: Secondary | ICD-10-CM | POA: Diagnosis not present

## 2016-01-22 DIAGNOSIS — R040 Epistaxis: Secondary | ICD-10-CM | POA: Diagnosis not present

## 2016-01-22 DIAGNOSIS — E119 Type 2 diabetes mellitus without complications: Secondary | ICD-10-CM | POA: Diagnosis not present

## 2016-01-22 DIAGNOSIS — Z7901 Long term (current) use of anticoagulants: Secondary | ICD-10-CM | POA: Diagnosis not present

## 2016-01-22 DIAGNOSIS — Z79899 Other long term (current) drug therapy: Secondary | ICD-10-CM | POA: Diagnosis not present

## 2016-01-22 DIAGNOSIS — Z882 Allergy status to sulfonamides status: Secondary | ICD-10-CM | POA: Diagnosis not present

## 2016-01-22 DIAGNOSIS — I4891 Unspecified atrial fibrillation: Secondary | ICD-10-CM | POA: Diagnosis not present

## 2016-01-26 DIAGNOSIS — R040 Epistaxis: Secondary | ICD-10-CM | POA: Diagnosis not present

## 2016-01-27 DIAGNOSIS — R040 Epistaxis: Secondary | ICD-10-CM | POA: Diagnosis not present

## 2016-02-02 DIAGNOSIS — I482 Chronic atrial fibrillation: Secondary | ICD-10-CM | POA: Diagnosis not present

## 2016-02-02 DIAGNOSIS — M170 Bilateral primary osteoarthritis of knee: Secondary | ICD-10-CM | POA: Diagnosis not present

## 2016-02-02 DIAGNOSIS — R42 Dizziness and giddiness: Secondary | ICD-10-CM | POA: Diagnosis not present

## 2016-02-18 DIAGNOSIS — R42 Dizziness and giddiness: Secondary | ICD-10-CM | POA: Diagnosis not present

## 2016-02-25 DIAGNOSIS — M19072 Primary osteoarthritis, left ankle and foot: Secondary | ICD-10-CM | POA: Diagnosis not present

## 2016-02-25 DIAGNOSIS — M79672 Pain in left foot: Secondary | ICD-10-CM | POA: Diagnosis not present

## 2016-03-31 DIAGNOSIS — R140 Abdominal distension (gaseous): Secondary | ICD-10-CM | POA: Diagnosis not present

## 2016-04-13 DIAGNOSIS — M25562 Pain in left knee: Secondary | ICD-10-CM | POA: Diagnosis not present

## 2016-04-13 DIAGNOSIS — M170 Bilateral primary osteoarthritis of knee: Secondary | ICD-10-CM | POA: Diagnosis not present

## 2016-04-13 DIAGNOSIS — M25561 Pain in right knee: Secondary | ICD-10-CM | POA: Diagnosis not present

## 2016-04-20 DIAGNOSIS — M170 Bilateral primary osteoarthritis of knee: Secondary | ICD-10-CM | POA: Diagnosis not present

## 2016-04-20 DIAGNOSIS — M25561 Pain in right knee: Secondary | ICD-10-CM | POA: Diagnosis not present

## 2016-04-20 DIAGNOSIS — M25562 Pain in left knee: Secondary | ICD-10-CM | POA: Diagnosis not present

## 2016-04-20 DIAGNOSIS — R262 Difficulty in walking, not elsewhere classified: Secondary | ICD-10-CM | POA: Diagnosis not present

## 2016-04-20 DIAGNOSIS — M1711 Unilateral primary osteoarthritis, right knee: Secondary | ICD-10-CM | POA: Diagnosis not present

## 2016-04-27 DIAGNOSIS — M1711 Unilateral primary osteoarthritis, right knee: Secondary | ICD-10-CM | POA: Diagnosis not present

## 2016-04-27 DIAGNOSIS — M25561 Pain in right knee: Secondary | ICD-10-CM | POA: Diagnosis not present

## 2016-05-03 DIAGNOSIS — M25561 Pain in right knee: Secondary | ICD-10-CM | POA: Diagnosis not present

## 2016-05-03 DIAGNOSIS — M1711 Unilateral primary osteoarthritis, right knee: Secondary | ICD-10-CM | POA: Diagnosis not present

## 2016-05-07 DIAGNOSIS — J300 Vasomotor rhinitis: Secondary | ICD-10-CM | POA: Diagnosis not present

## 2016-05-12 DIAGNOSIS — M1711 Unilateral primary osteoarthritis, right knee: Secondary | ICD-10-CM | POA: Diagnosis not present

## 2016-05-12 DIAGNOSIS — M25561 Pain in right knee: Secondary | ICD-10-CM | POA: Diagnosis not present

## 2016-05-17 DIAGNOSIS — M25561 Pain in right knee: Secondary | ICD-10-CM | POA: Diagnosis not present

## 2016-05-17 DIAGNOSIS — M1711 Unilateral primary osteoarthritis, right knee: Secondary | ICD-10-CM | POA: Diagnosis not present

## 2016-05-26 DIAGNOSIS — E1122 Type 2 diabetes mellitus with diabetic chronic kidney disease: Secondary | ICD-10-CM | POA: Diagnosis not present

## 2016-05-26 DIAGNOSIS — M810 Age-related osteoporosis without current pathological fracture: Secondary | ICD-10-CM | POA: Diagnosis not present

## 2016-05-26 DIAGNOSIS — I10 Essential (primary) hypertension: Secondary | ICD-10-CM | POA: Diagnosis not present

## 2016-05-26 DIAGNOSIS — I714 Abdominal aortic aneurysm, without rupture: Secondary | ICD-10-CM | POA: Diagnosis not present

## 2016-05-26 DIAGNOSIS — I482 Chronic atrial fibrillation: Secondary | ICD-10-CM | POA: Diagnosis not present

## 2016-05-26 DIAGNOSIS — N183 Chronic kidney disease, stage 3 (moderate): Secondary | ICD-10-CM | POA: Diagnosis not present

## 2016-05-26 DIAGNOSIS — E1142 Type 2 diabetes mellitus with diabetic polyneuropathy: Secondary | ICD-10-CM | POA: Diagnosis not present

## 2016-05-26 DIAGNOSIS — M170 Bilateral primary osteoarthritis of knee: Secondary | ICD-10-CM | POA: Diagnosis not present

## 2016-05-26 DIAGNOSIS — I340 Nonrheumatic mitral (valve) insufficiency: Secondary | ICD-10-CM | POA: Diagnosis not present

## 2016-05-31 DIAGNOSIS — S8011XD Contusion of right lower leg, subsequent encounter: Secondary | ICD-10-CM | POA: Diagnosis not present

## 2016-05-31 DIAGNOSIS — R6883 Chills (without fever): Secondary | ICD-10-CM | POA: Diagnosis not present

## 2016-06-04 DIAGNOSIS — S8011XD Contusion of right lower leg, subsequent encounter: Secondary | ICD-10-CM | POA: Diagnosis not present

## 2016-06-22 DIAGNOSIS — L97509 Non-pressure chronic ulcer of other part of unspecified foot with unspecified severity: Secondary | ICD-10-CM | POA: Diagnosis not present

## 2016-07-08 DIAGNOSIS — Z23 Encounter for immunization: Secondary | ICD-10-CM | POA: Diagnosis not present

## 2016-07-08 DIAGNOSIS — L97509 Non-pressure chronic ulcer of other part of unspecified foot with unspecified severity: Secondary | ICD-10-CM | POA: Diagnosis not present

## 2016-07-20 DIAGNOSIS — R42 Dizziness and giddiness: Secondary | ICD-10-CM | POA: Diagnosis not present

## 2016-07-20 DIAGNOSIS — L97519 Non-pressure chronic ulcer of other part of right foot with unspecified severity: Secondary | ICD-10-CM | POA: Diagnosis not present

## 2016-07-20 DIAGNOSIS — I482 Chronic atrial fibrillation: Secondary | ICD-10-CM | POA: Diagnosis not present

## 2016-07-20 DIAGNOSIS — R197 Diarrhea, unspecified: Secondary | ICD-10-CM | POA: Diagnosis not present

## 2016-07-21 ENCOUNTER — Telehealth: Payer: Self-pay | Admitting: Cardiology

## 2016-07-21 NOTE — Telephone Encounter (Signed)
Patient of Dr. Percival Spanish Hx of PAF, MR, TR, HTN  has had shortness of breath for about 3 days. feels kind of like she has a tight rope around waistline.  This symptom came up, thought might have something to do with her heart. Inquired about any other unusual findings. She notes loose 'runny' stools, concurrent to duration of other symptoms. Pt on Xarelto, notes no darkening of stools or blood in bowl/when wiping, denies other S&S of bleeding.  I reviewed chart/studies. Patient was imaged for abdominal aortic aneurysm most recently in 2015 with a recommended return study in 2 years time. test findings: "Small infrarenal fusiform dilation, larger than previous study, measuring 2.9 cm x 2.8 cm." (note the study in 2013 showed a 2.4cm x 2.4cm dilation.)  Pt has scheduled visit w/ Dr. Percival Spanish in [place] in 1 month but feels she needs to be seen sooner. aware we can likely arrange PA visit sooner in [place], she would prefer to be seen in [place] by Dr. Percival Spanish.  Will route to [employer] for recommendations.

## 2016-07-21 NOTE — Telephone Encounter (Signed)
I think it may be best to see an APP now in [place] to see if she needs an echo or AAA scan. Hard to tell based on the symptoms described in the phone conversation. We could then have that test performed and ready for review when she sees Dr. Percival Spanish in [place] in a month.

## 2016-07-21 NOTE — Telephone Encounter (Signed)
Called patient, no answer when dialed.

## 2016-07-21 NOTE — Telephone Encounter (Signed)
New message       Pt states she feels like she has a band wrapped around her stomach causing a little sob. Please advise

## 2016-07-23 NOTE — Telephone Encounter (Signed)
Called back. Patient notes she neglected to mention antibiotic treatment recently. Pt was on cephazolin for treatment foot infection. She saw PCP this week and they noted the patient was probably having GI side effects from the medication. She has stopped the antibiotic course and notes symptoms somewhat improved. She wants to wait on seeing a provider here at this time. She is aware to give this a week or so, and call us if med changes fail to completely address her symptoms; we will accomodate her w PA visit in interim of her appt w Dr. Percival Spanish. Pt agreeable to this, voiced understanding and thanks.

## 2016-07-26 DIAGNOSIS — R197 Diarrhea, unspecified: Secondary | ICD-10-CM | POA: Diagnosis not present

## 2016-08-19 ENCOUNTER — Encounter: Payer: Self-pay | Admitting: *Deleted

## 2016-08-24 DIAGNOSIS — M170 Bilateral primary osteoarthritis of knee: Secondary | ICD-10-CM | POA: Diagnosis not present

## 2016-08-24 DIAGNOSIS — G629 Polyneuropathy, unspecified: Secondary | ICD-10-CM | POA: Diagnosis not present

## 2016-08-24 DIAGNOSIS — E1122 Type 2 diabetes mellitus with diabetic chronic kidney disease: Secondary | ICD-10-CM | POA: Diagnosis not present

## 2016-08-24 DIAGNOSIS — M810 Age-related osteoporosis without current pathological fracture: Secondary | ICD-10-CM | POA: Diagnosis not present

## 2016-08-24 DIAGNOSIS — N183 Chronic kidney disease, stage 3 (moderate): Secondary | ICD-10-CM | POA: Diagnosis not present

## 2016-08-24 DIAGNOSIS — E7800 Pure hypercholesterolemia, unspecified: Secondary | ICD-10-CM | POA: Diagnosis not present

## 2016-08-24 DIAGNOSIS — I482 Chronic atrial fibrillation: Secondary | ICD-10-CM | POA: Diagnosis not present

## 2016-08-24 DIAGNOSIS — E1142 Type 2 diabetes mellitus with diabetic polyneuropathy: Secondary | ICD-10-CM | POA: Diagnosis not present

## 2016-08-24 DIAGNOSIS — I10 Essential (primary) hypertension: Secondary | ICD-10-CM | POA: Diagnosis not present

## 2016-09-01 ENCOUNTER — Ambulatory Visit: Payer: Self-pay | Admitting: Cardiology

## 2016-11-27 DIAGNOSIS — J209 Acute bronchitis, unspecified: Secondary | ICD-10-CM | POA: Diagnosis not present

## 2016-11-27 DIAGNOSIS — R05 Cough: Secondary | ICD-10-CM | POA: Diagnosis not present

## 2016-12-07 DIAGNOSIS — F329 Major depressive disorder, single episode, unspecified: Secondary | ICD-10-CM | POA: Diagnosis not present

## 2016-12-30 DIAGNOSIS — H04123 Dry eye syndrome of bilateral lacrimal glands: Secondary | ICD-10-CM | POA: Diagnosis not present

## 2016-12-30 DIAGNOSIS — H353131 Nonexudative age-related macular degeneration, bilateral, early dry stage: Secondary | ICD-10-CM | POA: Diagnosis not present

## 2017-01-03 DIAGNOSIS — E1122 Type 2 diabetes mellitus with diabetic chronic kidney disease: Secondary | ICD-10-CM | POA: Diagnosis not present

## 2017-01-03 DIAGNOSIS — G629 Polyneuropathy, unspecified: Secondary | ICD-10-CM | POA: Diagnosis not present

## 2017-01-03 DIAGNOSIS — R21 Rash and other nonspecific skin eruption: Secondary | ICD-10-CM | POA: Diagnosis not present

## 2017-01-03 DIAGNOSIS — K649 Unspecified hemorrhoids: Secondary | ICD-10-CM | POA: Diagnosis not present

## 2017-01-03 DIAGNOSIS — B370 Candidal stomatitis: Secondary | ICD-10-CM | POA: Diagnosis not present

## 2017-01-03 DIAGNOSIS — K5900 Constipation, unspecified: Secondary | ICD-10-CM | POA: Diagnosis not present

## 2017-01-14 DIAGNOSIS — I482 Chronic atrial fibrillation: Secondary | ICD-10-CM | POA: Diagnosis not present

## 2017-01-14 DIAGNOSIS — K5900 Constipation, unspecified: Secondary | ICD-10-CM | POA: Diagnosis not present

## 2017-01-14 DIAGNOSIS — Z0000 Encounter for general adult medical examination without abnormal findings: Secondary | ICD-10-CM | POA: Diagnosis not present

## 2017-01-14 DIAGNOSIS — M810 Age-related osteoporosis without current pathological fracture: Secondary | ICD-10-CM | POA: Diagnosis not present

## 2017-01-14 DIAGNOSIS — I10 Essential (primary) hypertension: Secondary | ICD-10-CM | POA: Diagnosis not present

## 2017-01-14 DIAGNOSIS — E1122 Type 2 diabetes mellitus with diabetic chronic kidney disease: Secondary | ICD-10-CM | POA: Diagnosis not present

## 2017-01-14 DIAGNOSIS — M170 Bilateral primary osteoarthritis of knee: Secondary | ICD-10-CM | POA: Diagnosis not present

## 2017-01-14 DIAGNOSIS — N183 Chronic kidney disease, stage 3 (moderate): Secondary | ICD-10-CM | POA: Diagnosis not present

## 2017-01-14 DIAGNOSIS — E1142 Type 2 diabetes mellitus with diabetic polyneuropathy: Secondary | ICD-10-CM | POA: Diagnosis not present

## 2017-01-18 ENCOUNTER — Telehealth: Payer: Self-pay

## 2017-01-18 NOTE — Telephone Encounter (Signed)
Records received from [place]. Records given to scheduling to schedule the patient an appointment.

## 2017-02-18 ENCOUNTER — Encounter: Payer: Self-pay | Admitting: Cardiology

## 2017-03-07 NOTE — Progress Notes (Signed)
Cardiology Office Note   Date:  03/09/2017   ID:  Debra Chen, DOB 08/13/1926, MRN [id number]  PCP:  Orpah Melter, MD  Cardiologist:   Minus Breeding, MD   Chief Complaint  Patient presents with  . Atrial Fibrillation      History of Present Illness: Debra Chen is a 83 y.o. female who presents for follow up of atrial fib aortic aneurysm and MR.  I have not seen her since 2015.  Since I last saw her she has done well.  The patient denies any new symptoms such as chest discomfort, neck or arm discomfort. There has been no new shortness of breath, PND or orthopnea. There have been no reported palpitations, presyncope or syncope.  She does work I the yard and activities of daily living.  She had her Lasix stopped since I last saw her and does not recall why.  However, she does not have swelling or SOB.  She has some mild dizziness at times and I suspect that this was why the Lasix was stopped.    Past Medical History:  Diagnosis Date  . HTN (hypertension)    x 20 years  . MR (mitral regurgitation)    mod  . PAF (paroxysmal atrial fibrillation) ([place])   . TR (tricuspid regurgitation)    mod     Past Surgical History:  Procedure Laterality Date  . TUBAL LIGATION       Current Outpatient Prescriptions  Medication Sig Dispense Refill  . atorvastatin (LIPITOR) 20 MG tablet Take 20 mg by mouth daily.    . benazepril (LOTENSIN) 20 MG tablet Take 20 mg by mouth daily.     . Calcium Carbonate (CALCIUM 600) 1500 MG TABS Take 1 tablet by mouth daily.      Alveda Reasons 15 MG TABS tablet Take 15 mg by mouth at bedtime.     . furosemide (LASIX) 20 MG tablet Take 0.5 tablets (10 mg total) by mouth as needed. 30 tablet 6  . HYDROcodone-acetaminophen (VICODIN) 5-500 MG per tablet Take 1 tablet by mouth every 6 (six) hours as needed.       No current facility-administered medications for this visit.     Allergies:   Sulfonamide derivatives     ROS:  Please see the history  of present illness.   Otherwise, review of systems are positive for none.   All other systems are reviewed and negative.    PHYSICAL EXAM: VS:  BP 136/62   Pulse 60   Ht 5\' 3"  (1.6 m)   Wt 141 lb (64 kg)   BMI 24.98 kg/m  , BMI Body mass index is 24.98 kg/m. GENERAL:  Well appearing HEENT:  Pupils equal round and reactive, fundi not visualized, oral mucosa unremarkable NECK:  No jugular venous distention, waveform within normal limits, carotid upstroke brisk and symmetric, no bruits, no thyromegaly LYMPHATICS:  No cervical, inguinal adenopathy LUNGS:  Clear to auscultation bilaterally BACK:  No CVA tenderness CHEST:  Unremarkable HEART:  PMI not displaced or sustained,S1 and S2 within normal limits, no S3, no S4, no clicks, no rubs, 3/6 apical systolic murmur radiating to the axilla, no diastolic murmurs ABD:  Flat, positive bowel sounds normal in frequency in pitch, no bruits, no rebound, no guarding, no midline pulsatile mass, no hepatomegaly, no splenomegaly EXT:  2 plus pulses throughout, no edema, no cyanosis no clubbing SKIN:  No rashes no nodules NEURO:  Cranial nerves II through XII grossly intact, motor  grossly intact throughout [facility]:  Cognitively intact, oriented to person place and time    EKG:  EKG is ordered today. The ekg ordered today demonstrates atrial fibrillation, rate 60 axis within normal limits, intervals within normal limits, digoxin changes on her ST segments.  03/09/2017   Recent Labs: No results found for requested labs within last 8760 hours.    Lipid Panel No results found for: CHOL, TRIG, HDL, CHOLHDL, VLDL, LDLCALC, LDLDIRECT    Wt Readings from Last 3 Encounters:  03/09/17 141 lb (64 kg)  05/22/14 152 lb (68.9 kg)  11/28/13 158 lb (71.7 kg)      Other studies Reviewed: Additional studies/ records that were reviewed today include: [facility] office records. Review of the above records demonstrates:  Please see elsewhere in the note.      ASSESSMENT AND PLAN:  ATRIAL FIB:    Ms. Debra Chen has a CHA2DS2 - VASc score of 4 with a risk of stroke of 4%. She should continue anticoagulation. However, I think she can discontinue digoxin as her rate is slow and would like to avoid medications as much as possible as she ages.    AAA:  I will arrange follow up ultrasound.    MR:  I will repeat an echocardiogram as it has been several years.  DYSLIPIDEMIA:  She questioned continuing the statin but since her LDL recently was 167 and she has diet-controlled diabetes I think there is still benefit.   Current medicines are reviewed at length with the patient today.  The patient does not have concerns regarding medicines.  The following changes have been made:  As above  Labs/ tests ordered today include:   Orders Placed This Encounter  Procedures  . EKG 12-Lead  . ECHOCARDIOGRAM COMPLETE     Disposition:   FU with me in six months.     Signed, Minus Breeding, MD  03/09/2017 6:34 PM    [facility]

## 2017-03-09 ENCOUNTER — Encounter: Payer: Self-pay | Admitting: Cardiology

## 2017-03-09 ENCOUNTER — Ambulatory Visit (INDEPENDENT_AMBULATORY_CARE_PROVIDER_SITE_OTHER): Payer: Commercial Managed Care - HMO | Admitting: Cardiology

## 2017-03-09 VITALS — BP 136/62 | HR 60 | Ht 63.0 in | Wt 141.0 lb

## 2017-03-09 DIAGNOSIS — I482 Chronic atrial fibrillation, unspecified: Secondary | ICD-10-CM

## 2017-03-09 DIAGNOSIS — I714 Abdominal aortic aneurysm, without rupture, unspecified: Secondary | ICD-10-CM

## 2017-03-09 DIAGNOSIS — I10 Essential (primary) hypertension: Secondary | ICD-10-CM | POA: Diagnosis not present

## 2017-03-09 DIAGNOSIS — I340 Nonrheumatic mitral (valve) insufficiency: Secondary | ICD-10-CM

## 2017-03-09 MED ORDER — FUROSEMIDE 20 MG PO TABS: 10.0000 mg | ORAL_TABLET | ORAL | 6 refills | Status: DC | PRN

## 2017-03-09 NOTE — Patient Instructions (Addendum)
Medication Instructions:  You may stop your Digoxin. You may take Furosemide 20 mg 1/2 tablet as needed once a day for swelling. Continue all other medications as listed.  Testing/Procedures: Your physician has requested that you have an echocardiogram. Echocardiography is a painless test that uses sound waves to create images of your heart. It provides your doctor with information about the size and shape of your heart and how well your heart's chambers and valves are working. This procedure takes approximately one hour. There are no restrictions for this procedure.  Your physician has requested that you have an abdominal aorta duplex. During this test, an ultrasound is used to evaluate the aorta. Allow 30 minutes for this exam. Do not eat after midnight the day before and avoid carbonated beverages  Follow-Up: Follow up in 6 months with Dr. Percival Spanish in [place].  You will receive a letter in the mail 2 months before you are due.  Please call us when you receive this letter to schedule your follow up appointment.  If you need a refill on your cardiac medications before your next appointment, please call your pharmacy.  Thank you for choosing [facility]!!

## 2017-04-06 ENCOUNTER — Other Ambulatory Visit (HOSPITAL_COMMUNITY): Payer: Commercial Managed Care - HMO

## 2017-04-21 ENCOUNTER — Ambulatory Visit (HOSPITAL_BASED_OUTPATIENT_CLINIC_OR_DEPARTMENT_OTHER): Payer: Medicare HMO

## 2017-04-21 ENCOUNTER — Other Ambulatory Visit: Payer: Self-pay

## 2017-04-21 ENCOUNTER — Ambulatory Visit (HOSPITAL_COMMUNITY)
Admission: RE | Admit: 2017-04-21 | Discharge: 2017-04-21 | Disposition: A | Discharge status: Home / Self Care | Payer: Medicare HMO | Source: Ambulatory Visit | Attending: Cardiology | Admitting: Cardiology

## 2017-04-21 DIAGNOSIS — I714 Abdominal aortic aneurysm, without rupture, unspecified: Secondary | ICD-10-CM

## 2017-04-21 DIAGNOSIS — I10 Essential (primary) hypertension: Secondary | ICD-10-CM

## 2017-04-21 DIAGNOSIS — I083 Combined rheumatic disorders of mitral, aortic and tricuspid valves: Secondary | ICD-10-CM | POA: Insufficient documentation

## 2017-04-21 DIAGNOSIS — I340 Nonrheumatic mitral (valve) insufficiency: Secondary | ICD-10-CM | POA: Diagnosis not present

## 2017-04-28 ENCOUNTER — Telehealth: Payer: Self-pay | Admitting: Cardiology

## 2017-04-28 NOTE — Telephone Encounter (Signed)
Left message for pt to call.

## 2017-04-28 NOTE — Telephone Encounter (Signed)
New message   Pt is calling about results from her 2 recent tests performed last Thursday. She requests a call back.

## 2017-04-28 NOTE — Telephone Encounter (Signed)
Patient aware of echo and abdominal duplex results.

## 2017-05-31 DIAGNOSIS — E7800 Pure hypercholesterolemia, unspecified: Secondary | ICD-10-CM | POA: Diagnosis not present

## 2017-05-31 DIAGNOSIS — I482 Chronic atrial fibrillation: Secondary | ICD-10-CM | POA: Diagnosis not present

## 2017-05-31 DIAGNOSIS — E1122 Type 2 diabetes mellitus with diabetic chronic kidney disease: Secondary | ICD-10-CM | POA: Diagnosis not present

## 2017-08-23 ENCOUNTER — Other Ambulatory Visit: Payer: Self-pay

## 2017-08-23 MED ORDER — FUROSEMIDE 20 MG PO TABS: 10.0000 mg | ORAL_TABLET | ORAL | 1 refills | Status: DC | PRN

## 2017-08-25 ENCOUNTER — Other Ambulatory Visit: Payer: Self-pay | Admitting: *Deleted

## 2017-08-25 MED ORDER — FUROSEMIDE 20 MG PO TABS: ORAL_TABLET | ORAL | 0 refills | Status: DC

## 2017-08-25 MED ORDER — FUROSEMIDE 20 MG PO TABS: ORAL_TABLET | ORAL | 2 refills | Status: DC

## 2017-09-13 DIAGNOSIS — M67431 Ganglion, right wrist: Secondary | ICD-10-CM | POA: Diagnosis not present

## 2017-09-13 DIAGNOSIS — M1990 Unspecified osteoarthritis, unspecified site: Secondary | ICD-10-CM | POA: Diagnosis not present

## 2017-09-13 DIAGNOSIS — B370 Candidal stomatitis: Secondary | ICD-10-CM | POA: Diagnosis not present

## 2017-10-25 NOTE — Progress Notes (Signed)
Cardiology Office Note   Date:  10/26/2017   ID:  Debra Chen September 08, 1926, MRN [id number]  PCP:  Orpah Melter, MD  Cardiologist:   Minus Breeding, MD   Chief Complaint  Patient presents with  . Palpitations      History of Present Illness: Debra Chen is a 83 y.o. female who presents for follow up of atrial fib.  She says that she has been getting episodes of palpitations almost nightly.  He seemed to wake her from her sleep.  She describes a rapid heart rate.  It does not happen during the day.  She gets somewhat short of breath with this.  He feels abdominal bloating and gas as well.  She was worried that this was her abdominal aneurysm.  She is not feeling any palpitations, syncope or presyncope.  She is not having any chest pressure, neck or arm discomfort.  She gets around with a cane.  She has no new PND or orthopnea.   Past Medical History:  Diagnosis Date  . HTN (hypertension)    x 20 years  . MR (mitral regurgitation)    mod  . PAF (paroxysmal atrial fibrillation) ([place])   . TR (tricuspid regurgitation)    mod      Current Outpatient Medications  Medication Sig Dispense Refill  . atorvastatin (LIPITOR) 20 MG tablet Take 20 mg by mouth daily.    . benazepril (LOTENSIN) 20 MG tablet Take 20 mg by mouth daily.     . Calcium Carbonate (CALCIUM 600) 1500 MG TABS Take 1 tablet by mouth daily.      . furosemide (LASIX) 20 MG tablet 1/2 TABLET ONCE DAILY AS NEEDED 45 tablet 2  . HYDROcodone-acetaminophen (VICODIN) 5-500 MG per tablet Take 1 tablet by mouth every 6 (six) hours as needed.      Alveda Reasons 15 MG TABS tablet Take 15 mg by mouth at bedtime.      No current facility-administered medications for this visit.     Allergies:   Sulfonamide derivatives     ROS:  Please see the history of present illness.   Otherwise, review of systems are positive for joint pains.   All other systems are reviewed and negative.    PHYSICAL EXAM: VS:  BP  138/60   Pulse 86   Ht 5' 3.5" (1.613 m)   Wt 148 lb (67.1 kg)   BMI 25.81 kg/m  , BMI Body mass index is 25.81 kg/m.  GENERAL:  Well appearing NECK:  No jugular venous distention, waveform within normal limits, carotid upstroke brisk and symmetric, no bruits, no thyromegaly LUNGS:  Clear to auscultation bilaterally CHEST:  Unremarkable HEART:  PMI not displaced or sustained,S1 and S2 within normal limits, no S3, no clicks, no rubs, 3 out of 6 apical systolic murmur radiating slightly at the aortic outflow tract, no diastolic murmurs, irregular ABD:  Flat, positive bowel sounds normal in frequency in pitch, no bruits, no rebound, no guarding, no midline pulsatile mass, no hepatomegaly, no splenomegaly EXT:  2 plus pulses throughout, no edema, no cyanosis no clubbing   EKG:  EKG is not ordered today.   Recent Labs: No results found for requested labs within last 8760 hours.    Lipid Panel No results found for: CHOL, TRIG, HDL, CHOLHDL, VLDL, LDLCALC, LDLDIRECT    Wt Readings from Last 3 Encounters:  10/26/17 148 lb (67.1 kg)  03/09/17 141 lb (64 kg)  05/22/14 152 lb (68.9 kg)  Other studies Reviewed: Additional studies/ records that were reviewed today include: None Review of the above records demonstrates:    ASSESSMENT AND PLAN:  ATRIAL FIB:    [name] has a CHA2DS2 - VASc score of 4 with a risk of stroke of 4%.  I will check a 24-hour Holter given her increased palpitations.  She is tolerating the blood thinner and will use this.   AAA:  In June this was 2.9 cm x 3.5 cm, with irregular plaque protruding into lumen. I will follow-up with another ultrasound is suggested.  MR:  This was mild in June.  I will follow this clinically.  No further imaging at this point.   DYSLIPIDEMIA:   I think she should continue the statin.  We discussed the benefit of this which is probably limited but I think still justifiable at her age   Current medicines are  reviewed at length with the patient today.  The patient does not have concerns regarding medicines.  The following changes have been made:  None  Labs/ tests ordered today include:    Orders Placed This Encounter  Procedures  . Holter monitor - 24 hour     Disposition:   FU with me in 4months.     Signed, Minus Breeding, MD  10/26/2017 2:00 PM    [place]

## 2017-10-26 ENCOUNTER — Ambulatory Visit: Payer: Medicare HMO | Admitting: Cardiology

## 2017-10-26 ENCOUNTER — Encounter: Payer: Self-pay | Admitting: Cardiology

## 2017-10-26 VITALS — BP 138/60 | HR 86 | Ht 63.5 in | Wt 148.0 lb

## 2017-10-26 DIAGNOSIS — I714 Abdominal aortic aneurysm, without rupture, unspecified: Secondary | ICD-10-CM

## 2017-10-26 DIAGNOSIS — I480 Paroxysmal atrial fibrillation: Secondary | ICD-10-CM

## 2017-10-26 NOTE — Patient Instructions (Signed)
Medication Instructions:  The current medical regimen is effective;  continue present plan and medications.  Testing/Procedures: Your physician has requested that you have an abdominal aorta duplex. During this test, an ultrasound is used to evaluate the aorta. Allow 30 minutes for this exam. Do not eat after midnight the day before and avoid carbonated beverages  Your physician has recommended that you wear a holter monitor for 24 hours. This will be placed at [facility].  Holter monitors are medical devices that record the heart's electrical activity. Doctors most often use these monitors to diagnose arrhythmias. Arrhythmias are problems with the speed or rhythm of the heartbeat. The monitor is a small, portable device. You can wear one while you do your normal daily activities. This is usually used to diagnose what is causing palpitations/syncope (passing out).  Follow-Up: Follow up in 4 months with Dr Percival Spanish in [place].  If you need a refill on your cardiac medications before your next appointment, please call your pharmacy.  Thank you for choosing [facility]!!

## 2017-11-15 ENCOUNTER — Inpatient Hospital Stay (HOSPITAL_COMMUNITY): Admission: RE | Admit: 2017-11-15 | Payer: Medicare HMO | Source: Ambulatory Visit

## 2017-12-14 ENCOUNTER — Ambulatory Visit: Payer: Medicare HMO | Admitting: Cardiology

## 2017-12-14 DIAGNOSIS — I10 Essential (primary) hypertension: Secondary | ICD-10-CM | POA: Diagnosis not present

## 2017-12-14 DIAGNOSIS — E1122 Type 2 diabetes mellitus with diabetic chronic kidney disease: Secondary | ICD-10-CM | POA: Diagnosis not present

## 2017-12-14 DIAGNOSIS — M170 Bilateral primary osteoarthritis of knee: Secondary | ICD-10-CM | POA: Diagnosis not present

## 2017-12-14 DIAGNOSIS — I482 Chronic atrial fibrillation: Secondary | ICD-10-CM | POA: Diagnosis not present

## 2017-12-14 DIAGNOSIS — L84 Corns and callosities: Secondary | ICD-10-CM | POA: Diagnosis not present

## 2017-12-14 DIAGNOSIS — E1142 Type 2 diabetes mellitus with diabetic polyneuropathy: Secondary | ICD-10-CM | POA: Diagnosis not present

## 2017-12-14 DIAGNOSIS — I340 Nonrheumatic mitral (valve) insufficiency: Secondary | ICD-10-CM | POA: Diagnosis not present

## 2017-12-14 DIAGNOSIS — E782 Mixed hyperlipidemia: Secondary | ICD-10-CM | POA: Diagnosis not present

## 2018-01-06 DIAGNOSIS — J3089 Other allergic rhinitis: Secondary | ICD-10-CM | POA: Diagnosis not present

## 2018-02-14 DIAGNOSIS — M170 Bilateral primary osteoarthritis of knee: Secondary | ICD-10-CM | POA: Diagnosis not present

## 2018-02-14 DIAGNOSIS — I482 Chronic atrial fibrillation: Secondary | ICD-10-CM | POA: Diagnosis not present

## 2018-02-14 DIAGNOSIS — Z7901 Long term (current) use of anticoagulants: Secondary | ICD-10-CM | POA: Diagnosis not present

## 2018-02-14 DIAGNOSIS — B370 Candidal stomatitis: Secondary | ICD-10-CM | POA: Diagnosis not present

## 2018-02-14 DIAGNOSIS — E1042 Type 1 diabetes mellitus with diabetic polyneuropathy: Secondary | ICD-10-CM | POA: Diagnosis not present

## 2018-02-14 DIAGNOSIS — E1122 Type 2 diabetes mellitus with diabetic chronic kidney disease: Secondary | ICD-10-CM | POA: Diagnosis not present

## 2018-02-22 ENCOUNTER — Telehealth: Payer: Self-pay | Admitting: Cardiology

## 2018-02-22 NOTE — Telephone Encounter (Signed)
New Message   Pt states that she has not been feeling good and would like to speak to a nurse. Please call

## 2018-02-22 NOTE — Telephone Encounter (Signed)
Follow Up:    Please call,says she needs to talk to you about scheduling her test.

## 2018-02-22 NOTE — Telephone Encounter (Signed)
Returned call to patient, patient reports she was suppose to have testing completed in December but there was a snow storm and this got cancelled.   She states she never rescheduled (AAA duplex).  She also states she tried to get her monitor placed but was unable to do this as well (they were all loaned out?).    Patient reports continued palpitations since appt.   She does not take BP and HR and is unsure if her HR has been high.  Denies SOB, CP.  Reports dizziness and HA but states she saw PCP recently and attributes this to allergies.   Patient was wondering if she should get these test completed now or have an OV first.    Advised I would route to Dr. Percival Spanish to review.    HX: Afib on Xarelto

## 2018-02-22 NOTE — Telephone Encounter (Signed)
Spoke with patient who reports she is feeling poorly d/t allergies and does not want to have testing completed at this time.  She does states she has been having more palpitations and would like to have 24 hr monitor placed.  She is willing to come it that.  Advised  I will have the scheduling department c/b to schedule that appt for her.  She is in agreement.

## 2018-03-02 NOTE — Telephone Encounter (Signed)
Pt has been scheduled for monitor 03/15/18.

## 2018-03-13 DIAGNOSIS — H353131 Nonexudative age-related macular degeneration, bilateral, early dry stage: Secondary | ICD-10-CM | POA: Diagnosis not present

## 2018-03-15 ENCOUNTER — Ambulatory Visit (INDEPENDENT_AMBULATORY_CARE_PROVIDER_SITE_OTHER): Payer: Medicare HMO

## 2018-03-15 ENCOUNTER — Ambulatory Visit (HOSPITAL_COMMUNITY)
Admission: RE | Admit: 2018-03-15 | Discharge: 2018-03-15 | Disposition: A | Discharge status: Home / Self Care | Payer: Medicare HMO | Source: Ambulatory Visit | Attending: Cardiology | Admitting: Cardiology

## 2018-03-15 DIAGNOSIS — I714 Abdominal aortic aneurysm, without rupture, unspecified: Secondary | ICD-10-CM

## 2018-03-15 DIAGNOSIS — I480 Paroxysmal atrial fibrillation: Secondary | ICD-10-CM

## 2018-03-16 DIAGNOSIS — M170 Bilateral primary osteoarthritis of knee: Secondary | ICD-10-CM | POA: Diagnosis not present

## 2018-03-16 DIAGNOSIS — E1142 Type 2 diabetes mellitus with diabetic polyneuropathy: Secondary | ICD-10-CM | POA: Diagnosis not present

## 2018-03-16 DIAGNOSIS — E1122 Type 2 diabetes mellitus with diabetic chronic kidney disease: Secondary | ICD-10-CM | POA: Diagnosis not present

## 2018-03-16 DIAGNOSIS — I482 Chronic atrial fibrillation: Secondary | ICD-10-CM | POA: Diagnosis not present

## 2018-03-16 DIAGNOSIS — G8929 Other chronic pain: Secondary | ICD-10-CM | POA: Diagnosis not present

## 2018-03-16 DIAGNOSIS — M25561 Pain in right knee: Secondary | ICD-10-CM | POA: Diagnosis not present

## 2018-03-20 ENCOUNTER — Telehealth: Payer: Self-pay | Admitting: Cardiology

## 2018-03-20 ENCOUNTER — Encounter: Payer: Self-pay | Admitting: *Deleted

## 2018-03-20 ENCOUNTER — Telehealth: Payer: Self-pay | Admitting: *Deleted

## 2018-03-20 DIAGNOSIS — I714 Abdominal aortic aneurysm, without rupture, unspecified: Secondary | ICD-10-CM

## 2018-03-20 NOTE — Telephone Encounter (Signed)
Pt aware of her monitor, order placed for 1 year.Marland Kitchen

## 2018-03-20 NOTE — Telephone Encounter (Signed)
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  Follow up in one year with a repeat Doppler.  Call Ms. Dafoe with the results and send results to Orpah Melter, MD

## 2018-03-20 NOTE — Telephone Encounter (Signed)
Spoke with patient, her sister brought her to her appointment and she parked too much in the cross walk, ticket written. She is trying to get her sister out of the ticket. Patient  is just needing a letter stating she had an appointment at our office on 03/15/18. Letter mailed to patient as requested

## 2018-03-20 NOTE — Telephone Encounter (Signed)
New message   Patient calling requesting a letter stating she had an appointment here on 03/15/18. Patient was blocking the crosswalk, thinking it was a handicap space and received parking ticket for  $200.  Patient requesting letter be mailed to her.

## 2018-03-22 ENCOUNTER — Telehealth: Payer: Self-pay | Admitting: Cardiology

## 2018-03-22 NOTE — Telephone Encounter (Signed)
New Message:       Pt is calling to see if she can pick up a letter stating that she was in the office for a visit on 03/15/18. Previous notes states that we mailed this letter to pt but pt states she has not received it. Pt would like to come to the office and pick it up tomorrow. Pls call pt to let her know if the letter will be ready for pick up tomorrow.

## 2018-03-22 NOTE — Telephone Encounter (Signed)
PATIENT AWARE CAN PICK LETTER AT FRONT DESK.

## 2018-04-24 DIAGNOSIS — S8012XA Contusion of left lower leg, initial encounter: Secondary | ICD-10-CM | POA: Diagnosis not present

## 2018-04-24 DIAGNOSIS — M79605 Pain in left leg: Secondary | ICD-10-CM | POA: Diagnosis not present

## 2018-05-10 DIAGNOSIS — I10 Essential (primary) hypertension: Secondary | ICD-10-CM | POA: Diagnosis not present

## 2018-05-10 DIAGNOSIS — N183 Chronic kidney disease, stage 3 (moderate): Secondary | ICD-10-CM | POA: Diagnosis not present

## 2018-05-10 DIAGNOSIS — E1122 Type 2 diabetes mellitus with diabetic chronic kidney disease: Secondary | ICD-10-CM | POA: Diagnosis not present

## 2018-05-15 DIAGNOSIS — I482 Chronic atrial fibrillation: Secondary | ICD-10-CM | POA: Diagnosis not present

## 2018-05-15 DIAGNOSIS — I714 Abdominal aortic aneurysm, without rupture: Secondary | ICD-10-CM | POA: Diagnosis not present

## 2018-05-15 DIAGNOSIS — E7800 Pure hypercholesterolemia, unspecified: Secondary | ICD-10-CM | POA: Diagnosis not present

## 2018-05-15 DIAGNOSIS — Z0000 Encounter for general adult medical examination without abnormal findings: Secondary | ICD-10-CM | POA: Diagnosis not present

## 2018-05-15 DIAGNOSIS — E1142 Type 2 diabetes mellitus with diabetic polyneuropathy: Secondary | ICD-10-CM | POA: Diagnosis not present

## 2018-05-31 DIAGNOSIS — Z7901 Long term (current) use of anticoagulants: Secondary | ICD-10-CM | POA: Diagnosis not present

## 2018-05-31 DIAGNOSIS — R42 Dizziness and giddiness: Secondary | ICD-10-CM | POA: Diagnosis not present

## 2018-05-31 DIAGNOSIS — E1142 Type 2 diabetes mellitus with diabetic polyneuropathy: Secondary | ICD-10-CM | POA: Diagnosis not present

## 2018-05-31 DIAGNOSIS — E1122 Type 2 diabetes mellitus with diabetic chronic kidney disease: Secondary | ICD-10-CM | POA: Diagnosis not present

## 2018-05-31 DIAGNOSIS — S8992XA Unspecified injury of left lower leg, initial encounter: Secondary | ICD-10-CM | POA: Diagnosis not present

## 2018-05-31 DIAGNOSIS — I482 Chronic atrial fibrillation: Secondary | ICD-10-CM | POA: Diagnosis not present

## 2018-06-08 DIAGNOSIS — L0390 Cellulitis, unspecified: Secondary | ICD-10-CM | POA: Diagnosis not present

## 2018-06-08 DIAGNOSIS — S8992XD Unspecified injury of left lower leg, subsequent encounter: Secondary | ICD-10-CM | POA: Diagnosis not present

## 2018-06-20 DIAGNOSIS — S81802D Unspecified open wound, left lower leg, subsequent encounter: Secondary | ICD-10-CM | POA: Diagnosis not present

## 2018-06-20 DIAGNOSIS — S8992XA Unspecified injury of left lower leg, initial encounter: Secondary | ICD-10-CM | POA: Diagnosis not present

## 2018-06-27 DIAGNOSIS — I4891 Unspecified atrial fibrillation: Secondary | ICD-10-CM | POA: Diagnosis not present

## 2018-06-27 DIAGNOSIS — R609 Edema, unspecified: Secondary | ICD-10-CM | POA: Diagnosis not present

## 2018-06-27 DIAGNOSIS — I872 Venous insufficiency (chronic) (peripheral): Secondary | ICD-10-CM | POA: Diagnosis not present

## 2018-06-27 DIAGNOSIS — E1159 Type 2 diabetes mellitus with other circulatory complications: Secondary | ICD-10-CM | POA: Diagnosis not present

## 2018-06-27 DIAGNOSIS — L97921 Non-pressure chronic ulcer of unspecified part of left lower leg limited to breakdown of skin: Secondary | ICD-10-CM | POA: Diagnosis not present

## 2018-06-28 DIAGNOSIS — L97921 Non-pressure chronic ulcer of unspecified part of left lower leg limited to breakdown of skin: Secondary | ICD-10-CM | POA: Diagnosis not present

## 2018-07-11 DIAGNOSIS — I872 Venous insufficiency (chronic) (peripheral): Secondary | ICD-10-CM | POA: Diagnosis not present

## 2018-07-11 DIAGNOSIS — I4891 Unspecified atrial fibrillation: Secondary | ICD-10-CM | POA: Diagnosis not present

## 2018-07-11 DIAGNOSIS — L97921 Non-pressure chronic ulcer of unspecified part of left lower leg limited to breakdown of skin: Secondary | ICD-10-CM | POA: Diagnosis not present

## 2018-07-11 DIAGNOSIS — I83029 Varicose veins of left lower extremity with ulcer of unspecified site: Secondary | ICD-10-CM | POA: Diagnosis not present

## 2018-07-11 DIAGNOSIS — L97929 Non-pressure chronic ulcer of unspecified part of left lower leg with unspecified severity: Secondary | ICD-10-CM | POA: Diagnosis not present

## 2018-07-18 ENCOUNTER — Ambulatory Visit (HOSPITAL_BASED_OUTPATIENT_CLINIC_OR_DEPARTMENT_OTHER): Payer: Medicare HMO

## 2018-08-18 DIAGNOSIS — M170 Bilateral primary osteoarthritis of knee: Secondary | ICD-10-CM | POA: Diagnosis not present

## 2018-08-18 DIAGNOSIS — N183 Chronic kidney disease, stage 3 (moderate): Secondary | ICD-10-CM | POA: Diagnosis not present

## 2018-08-18 DIAGNOSIS — E1142 Type 2 diabetes mellitus with diabetic polyneuropathy: Secondary | ICD-10-CM | POA: Diagnosis not present

## 2018-08-18 DIAGNOSIS — I714 Abdominal aortic aneurysm, without rupture: Secondary | ICD-10-CM | POA: Diagnosis not present

## 2018-08-18 DIAGNOSIS — E1122 Type 2 diabetes mellitus with diabetic chronic kidney disease: Secondary | ICD-10-CM | POA: Diagnosis not present

## 2018-08-18 DIAGNOSIS — I480 Paroxysmal atrial fibrillation: Secondary | ICD-10-CM | POA: Diagnosis not present

## 2018-08-18 DIAGNOSIS — I10 Essential (primary) hypertension: Secondary | ICD-10-CM | POA: Diagnosis not present

## 2018-11-15 DIAGNOSIS — E1122 Type 2 diabetes mellitus with diabetic chronic kidney disease: Secondary | ICD-10-CM | POA: Diagnosis not present

## 2018-11-15 DIAGNOSIS — M170 Bilateral primary osteoarthritis of knee: Secondary | ICD-10-CM | POA: Diagnosis not present

## 2018-11-23 DIAGNOSIS — Z87891 Personal history of nicotine dependence: Secondary | ICD-10-CM | POA: Diagnosis not present

## 2018-11-23 DIAGNOSIS — I10 Essential (primary) hypertension: Secondary | ICD-10-CM | POA: Diagnosis not present

## 2018-11-23 DIAGNOSIS — Z882 Allergy status to sulfonamides status: Secondary | ICD-10-CM | POA: Diagnosis not present

## 2018-11-23 DIAGNOSIS — M4316 Spondylolisthesis, lumbar region: Secondary | ICD-10-CM | POA: Diagnosis not present

## 2018-11-23 DIAGNOSIS — S32501A Unspecified fracture of right pubis, initial encounter for closed fracture: Secondary | ICD-10-CM | POA: Diagnosis not present

## 2018-11-23 DIAGNOSIS — E119 Type 2 diabetes mellitus without complications: Secondary | ICD-10-CM | POA: Diagnosis not present

## 2018-11-23 DIAGNOSIS — I4891 Unspecified atrial fibrillation: Secondary | ICD-10-CM | POA: Diagnosis not present

## 2018-11-23 DIAGNOSIS — N281 Cyst of kidney, acquired: Secondary | ICD-10-CM | POA: Diagnosis not present

## 2018-11-23 DIAGNOSIS — Z79899 Other long term (current) drug therapy: Secondary | ICD-10-CM | POA: Diagnosis not present

## 2018-11-23 DIAGNOSIS — M25552 Pain in left hip: Secondary | ICD-10-CM | POA: Diagnosis not present

## 2018-11-23 DIAGNOSIS — M47896 Other spondylosis, lumbar region: Secondary | ICD-10-CM | POA: Diagnosis not present

## 2018-11-23 DIAGNOSIS — R1900 Intra-abdominal and pelvic swelling, mass and lump, unspecified site: Secondary | ICD-10-CM | POA: Diagnosis not present

## 2018-11-27 DIAGNOSIS — N281 Cyst of kidney, acquired: Secondary | ICD-10-CM | POA: Diagnosis not present

## 2018-11-28 DIAGNOSIS — S3210XA Unspecified fracture of sacrum, initial encounter for closed fracture: Secondary | ICD-10-CM | POA: Diagnosis not present

## 2018-11-28 DIAGNOSIS — M5432 Sciatica, left side: Secondary | ICD-10-CM | POA: Diagnosis not present

## 2019-01-03 ENCOUNTER — Other Ambulatory Visit: Payer: Self-pay | Admitting: Cardiology

## 2019-01-10 DIAGNOSIS — S7401XA Injury of sciatic nerve at hip and thigh level, right leg, initial encounter: Secondary | ICD-10-CM | POA: Diagnosis not present

## 2019-03-02 ENCOUNTER — Telehealth: Payer: Self-pay | Admitting: *Deleted

## 2019-03-02 NOTE — Telephone Encounter (Signed)
TELEPHONE CALL NOTE  This patient has been deemed a candidate for follow-up tele-health visit to limit community exposure during the Covid-19 pandemic. I spoke with the patient via phone to discuss instructions. This has been outlined on the patient's AVS (dotphrase: [email or web address]). The patient was advised to review the section on consent for treatment as well. The patient will receive a phone call 2-3 days prior to their E-Visit at which time consent will be verbally confirmed.   A Virtual Office Visit appointment type has been scheduled for 4/29 with Dr Percival Spanish, with "VIDEO" or "TELEPHONE" in the appointment notes - patient prefers  PHONE I have either confirmed the patient is active in [place] or offered to send sign-up link to phone/email via [place] beside patient's photo. Pt does not have Internet.   Verbal consent obtained for phone visit.  Ellwood Dense, RN 03/02/2019 3:07 PM   YOUR CARDIOLOGY TEAM HAS ARRANGED FOR AN E-VISIT FOR YOUR APPOINTMENT - PLEASE REVIEW IMPORTANT INFORMATION BELOW SEVERAL DAYS PRIOR TO YOUR APPOINTMENT  Due to the recent COVID-19 pandemic, we are transitioning in-person office visits to tele-medicine visits in an effort to decrease unnecessary exposure to our patients, their families, and staff. These visits are billed to your insurance just like a normal visit is. We also encourage you to sign up for MyChart if you have not already done so. You will need a smartphone if possible. For patients that do not have this, we can still complete the visit using a regular telephone but do prefer a smartphone to enable video when possible. You may have a family member that lives with you that can help. If possible, we also ask that you have a blood pressure cuff and scale at home to measure your blood pressure, heart rate and weight prior to your scheduled appointment. Patients with clinical needs that need an in-person evaluation and testing will still be able to  come to the office if absolutely necessary. If you have any questions, feel free to call our office.  YOUR PROVIDER WILL BE USING THE FOLLOWING PLATFORM TO COMPLETE YOUR VISIT: PHONE   2-3 DAYS BEFORE YOUR APPOINTMENT  You will receive a telephone call from one of our [facility] team members - your caller ID may say "Unknown caller." If this is a video visit, we will walk you through how to get the video launched on your phone. We will remind you check your blood pressure, heart rate and weight prior to your scheduled appointment. If you have an Apple Watch or Kardia, please upload any pertinent ECG strips the day before or morning of your appointment to [facility]. Our staff will also make sure you have reviewed the consent and agree to move forward with your scheduled tele-health visit.   THE DAY OF YOUR APPOINTMENT  Approximately 15 minutes prior to your scheduled appointment, you will receive a telephone call from one of [facility] team - your caller ID may say "Unknown caller."  Our staff will confirm medications, vital signs for the day and any symptoms you may be experiencing. Please have this information available prior to the time of visit start. It may also be helpful for you to have a pad of paper and pen handy for any instructions given during your visit. They will also walk you through joining the smartphone meeting if this is a video visit.    CONSENT FOR TELE-HEALTH VISIT - PLEASE REVIEW  I hereby voluntarily request, consent and authorize [facility] and its employed  or contracted physicians, physician assistants, nurse practitioners or other licensed health care professionals (the Practitioner), to provide me with telemedicine health care services (the Services") as deemed necessary by the treating Practitioner. I acknowledge and consent to receive the Services by the Practitioner via telemedicine. I understand that the telemedicine visit will involve communicating with the  Practitioner through live audiovisual communication technology and the disclosure of certain medical information by electronic transmission. I acknowledge that I have been given the opportunity to request an in-person assessment or other available alternative prior to the telemedicine visit and am voluntarily participating in the telemedicine visit.  I understand that I have the right to withhold or withdraw my consent to the use of telemedicine in the course of my care at any time, without affecting my right to future care or treatment, and that the Practitioner or I may terminate the telemedicine visit at any time. I understand that I have the right to inspect all information obtained and/or recorded in the course of the telemedicine visit and may receive copies of available information for a reasonable fee.  I understand that some of the potential risks of receiving the Services via telemedicine include:   Delay or interruption in medical evaluation due to technological equipment failure or disruption;  Information transmitted may not be sufficient (e.g. poor resolution of images) to allow for appropriate medical decision making by the Practitioner; and/or   In rare instances, security protocols could fail, causing a breach of personal health information.  Furthermore, I acknowledge that it is my responsibility to provide information about my medical history, conditions and care that is complete and accurate to the best of my ability. I acknowledge that Practitioner's advice, recommendations, and/or decision may be based on factors not within their control, such as incomplete or inaccurate data provided by me or distortions of diagnostic images or specimens that may result from electronic transmissions. I understand that the practice of medicine is not an exact science and that Practitioner makes no warranties or guarantees regarding treatment outcomes. I acknowledge that I will receive a copy of this  consent concurrently upon execution via email to the email address I last provided but may also request a printed copy by calling the office of [facility].    I understand that my insurance will be billed for this visit.   I have read or had this consent read to me.  I understand the contents of this consent, which adequately explains the benefits and risks of the Services being provided via telemedicine.   I have been provided ample opportunity to ask questions regarding this consent and the Services and have had my questions answered to my satisfaction.  I give my informed consent for the services to be provided through the use of telemedicine in my medical care  By participating in this telemedicine visit I agree to the above.  Verbal consent obtained

## 2019-03-06 NOTE — Progress Notes (Signed)
Virtual Visit via Telephone Note   This visit type was conducted due to national recommendations for restrictions regarding the COVID-19 Pandemic (e.g. social distancing) in an effort to limit this patient's exposure and mitigate transmission in our community.  Due to her co-morbid illnesses, this patient is at least at moderate risk for complications without adequate follow up.  This format is felt to be most appropriate for this patient at this time.  The patient did not have access to video technology/had technical difficulties with video requiring transitioning to audio format only (telephone).  All issues noted in this document were discussed and addressed.  No physical exam could be performed with this format.  Please refer to the patient's chart for her  consent to telehealth for [facility].   Evaluation Performed:  Follow-up visit  Date:  03/07/2019   ID:  Debra Chen [birth year], MRN [id number]  Patient Location: Home Provider Location: Home  PCP:  Orpah Melter, MD  Cardiologist:    Minus Breeding, MD Electrophysiologist:  None   Chief Complaint:     Atrial fib  History of Present Illness:    Debra Chen is a 85 y.o. female  who presents for follow up of atrial fib.  Since I last saw him he has done well.  She did fall on a throw rug.  However, she has not had any syncope.  She says she is doing relatively well.  She denies any cardiovascular symptoms such as chest discomfort, neck or arm discomfort.  She is not had any palpitations, presyncope or syncope.  She had no PND or orthopnea.  She had no weight gain or edema.  The patient does not have symptoms concerning for COVID-19 infection (fever, chills, cough, or new shortness of breath).    Past Medical History:  Diagnosis Date  . HTN (hypertension)    x 20 years  . MR (mitral regurgitation)    mod  . PAF (paroxysmal atrial fibrillation) ([place])   . TR (tricuspid regurgitation)    mod    Past  Surgical History:  Procedure Laterality Date  . TUBAL LIGATION       Prior to Admission medications   Medication Sig Start Date End Date Taking? Authorizing Provider  atorvastatin (LIPITOR) 20 MG tablet Take 20 mg by mouth daily.   Yes [provider]  benazepril (LOTENSIN) 20 MG tablet Take 20 mg by mouth daily.  01/17/17  Yes [provider]  Calcium Carbonate (CALCIUM 600) 1500 MG TABS Take 1 tablet by mouth daily.     Yes [provider]  furosemide (LASIX) 20 MG tablet TAKE 1/2 TABLET EVERY DAY AS NEEDED 01/03/19  Yes Minus Breeding, MD  HYDROcodone-acetaminophen (VICODIN) 5-500 MG per tablet Take 1 tablet by mouth every 6 (six) hours as needed.     Yes [provider]  XARELTO 15 MG TABS tablet Take 15 mg by mouth at bedtime.  05/08/14  Yes [provider]    Allergies:   Sulfonamide derivatives   Social History   Tobacco Use  . Smoking status: Former Smoker    Last attempt to quit: 05/10/1972    Years since quitting: 46.8  . Smokeless tobacco: Never Used  . Tobacco comment: smoked 1 ppd for 20 years; quit in 1989  Substance Use Topics  . Alcohol use: No  . Drug use: No     Family Hx: The patient's family history is not on file.  ROS:   Please  see the history of present illness.    As stated in the HPI and negative for all other systems.    Prior CV studies:   The following studies were reviewed today:  Labs  Labs/Other Tests and Data Reviewed:    EKG:  No ECG reviewed.  Recent Labs: No results found for requested labs within last 8760 hours.   Recent Lipid Panel No results found for: CHOL, TRIG, HDL, CHOLHDL, LDLCALC, LDLDIRECT  Wt Readings from Last 3 Encounters:  03/07/19 149 lb (67.6 kg)  10/26/17 148 lb (67.1 kg)  03/09/17 141 lb (64 kg)     Objective:    Vital Signs:  BP 132/72 (BP Location: Left Arm, Patient Position: Sitting, Cuff Size: Normal)   Ht 5\' 5"  (1.651 m)   Wt 149 lb (67.6 kg)   BMI 24.79  kg/m    NA  ASSESSMENT & PLAN:    ATRIAL FIB:    She had good rate control last year on Holter.  She does not notice this rhythm.  no change in therapy.  No further work up.    We did talk about her removing her throw rugs given the fact that she is on blood thinner and does not want a fall.  AAA:  In May of last year this was 2.9 cm x 3.5 cm.  I will follow this again in the summer next year.   MR:  This was mild in June of 2018.  She had moderate TR.  I will follow this clinically.   DYSLIPIDEMIA:     She questioned whether she should remain on the statin.  I suggested that she should.  Her lipids last year were excellent.  She has atherosclerotic disease in her aorta.  She tolerates the statin.  There is benefit.  I will check a lipid profile liver enzymes in a couple of months.  I will try to get the records from her primary office.  She will remain on the meds as listed.  COVID-19 Education: The signs and symptoms of COVID-19 were discussed with the patient and how to seek care for testing (follow up with PCP or arrange E-visit).  The importance of social distancing was discussed today  Time:   Today, I have spent 20 minutes with the patient with telehealth technology discussing the above problems.     Medication Adjustments/Labs and Tests Ordered: Current medicines are reviewed at length with the patient today.  Concerns regarding medicines are outlined above.   Tests Ordered: No orders of the defined types were placed in this encounter.   Medication Changes: No orders of the defined types were placed in this encounter.   Disposition:  Follow up see me in six months.   Signed, Minus Breeding, MD  03/07/2019 1:21 PM    [facility]

## 2019-03-07 ENCOUNTER — Telehealth (INDEPENDENT_AMBULATORY_CARE_PROVIDER_SITE_OTHER): Payer: Medicare HMO | Admitting: Cardiology

## 2019-03-07 ENCOUNTER — Encounter: Payer: Self-pay | Admitting: Cardiology

## 2019-03-07 VITALS — BP 132/72 | Ht 65.0 in | Wt 149.0 lb

## 2019-03-07 DIAGNOSIS — I714 Abdominal aortic aneurysm, without rupture, unspecified: Secondary | ICD-10-CM

## 2019-03-07 DIAGNOSIS — I482 Chronic atrial fibrillation, unspecified: Secondary | ICD-10-CM

## 2019-03-07 DIAGNOSIS — Z7189 Other specified counseling: Secondary | ICD-10-CM

## 2019-03-07 DIAGNOSIS — E785 Hyperlipidemia, unspecified: Secondary | ICD-10-CM

## 2019-03-07 NOTE — Patient Instructions (Signed)
Medication Instructions:  The current medical regimen is effective;  continue present plan and medications.  If you need a refill on your cardiac medications before your next appointment, please call your pharmacy.   Lab work: Please have blood work in 6 months at your primary care doctor's office (CBC, BMP, Lipid panel).  If you have labs (blood work) drawn today and your tests are completely normal, you will receive your results only by: Marland Kitchen MyChart Message (if you have MyChart) OR . A paper copy in the mail If you have any lab test that is abnormal or we need to change your treatment, we will call you to review the results.  Follow-Up: Follow up in 1 year with Dr. Percival Spanish in [place].  You will receive a letter in the mail 2 months before you are due.  Please call us when you receive this letter to schedule your follow up appointment.  Thank you for choosing [facility]!!

## 2019-03-22 ENCOUNTER — Other Ambulatory Visit: Payer: Self-pay | Admitting: Cardiology

## 2019-11-16 DIAGNOSIS — M549 Dorsalgia, unspecified: Secondary | ICD-10-CM | POA: Diagnosis not present

## 2019-11-23 DIAGNOSIS — G8929 Other chronic pain: Secondary | ICD-10-CM | POA: Diagnosis not present

## 2019-11-23 DIAGNOSIS — M549 Dorsalgia, unspecified: Secondary | ICD-10-CM | POA: Diagnosis not present

## 2019-11-26 DIAGNOSIS — L03319 Cellulitis of trunk, unspecified: Secondary | ICD-10-CM | POA: Diagnosis not present

## 2019-11-30 ENCOUNTER — Ambulatory Visit: Payer: Medicare HMO

## 2019-12-18 ENCOUNTER — Ambulatory Visit: Payer: Medicare HMO | Attending: Internal Medicine

## 2019-12-20 ENCOUNTER — Ambulatory Visit: Payer: Medicare HMO

## 2019-12-26 DIAGNOSIS — Z7901 Long term (current) use of anticoagulants: Secondary | ICD-10-CM | POA: Diagnosis not present

## 2019-12-26 DIAGNOSIS — E1122 Type 2 diabetes mellitus with diabetic chronic kidney disease: Secondary | ICD-10-CM | POA: Diagnosis not present

## 2019-12-26 DIAGNOSIS — I482 Chronic atrial fibrillation, unspecified: Secondary | ICD-10-CM | POA: Diagnosis not present

## 2019-12-26 DIAGNOSIS — I10 Essential (primary) hypertension: Secondary | ICD-10-CM | POA: Diagnosis not present

## 2019-12-26 DIAGNOSIS — G629 Polyneuropathy, unspecified: Secondary | ICD-10-CM | POA: Diagnosis not present

## 2019-12-26 DIAGNOSIS — R0781 Pleurodynia: Secondary | ICD-10-CM | POA: Diagnosis not present

## 2020-01-14 DIAGNOSIS — R300 Dysuria: Secondary | ICD-10-CM | POA: Diagnosis not present

## 2020-01-14 DIAGNOSIS — R319 Hematuria, unspecified: Secondary | ICD-10-CM | POA: Diagnosis not present

## 2020-03-11 NOTE — Progress Notes (Deleted)
Cardiology Office Note   Date:  03/11/2020   ID:  Debra, Chen [birth year], MRN [id number]  PCP:  Debra Melter, MD  Cardiologist:   No primary care provider on file. Referring:  ***  No chief complaint on file.     History of Present Illness: Debra Chen is a 86 y.o. female who presents for follow up of atrial fib.  Since I last saw him her ***   *** has done well.  She did fall on a throw rug.  However, she has not had any syncope.  She says she is doing relatively well.  She denies any cardiovascular symptoms such as chest discomfort, neck or arm discomfort.  She is not had any palpitations, presyncope or syncope.  She had no PND or orthopnea.  She had no weight gain or edema.   Past Medical History:  Diagnosis Date  . HTN (hypertension)    x 20 years  . MR (mitral regurgitation)    mod  . PAF (paroxysmal atrial fibrillation) ([place])   . TR (tricuspid regurgitation)    mod     Past Surgical History:  Procedure Laterality Date  . TUBAL LIGATION       Current Outpatient Medications  Medication Sig Dispense Refill  . atorvastatin (LIPITOR) 20 MG tablet Take 20 mg by mouth daily.    . benazepril (LOTENSIN) 20 MG tablet Take 20 mg by mouth daily.     . Calcium Carbonate (CALCIUM 600) 1500 MG TABS Take 1 tablet by mouth daily.      . furosemide (LASIX) 20 MG tablet TAKE 1/2 TABLET EVERY DAY AS NEEDED 45 tablet 3  . HYDROcodone-acetaminophen (VICODIN) 5-500 MG per tablet Take 1 tablet by mouth every 6 (six) hours as needed.      Alveda Reasons 15 MG TABS tablet Take 15 mg by mouth at bedtime.      No current facility-administered medications for this visit.    Allergies:   Sulfonamide derivatives   ROS:  Please see the history of present illness.   Otherwise, review of systems are positive for {NONE DEFAULTED:18576::"none"}.   All other systems are reviewed and negative.    PHYSICAL EXAM: VS:  There were no vitals taken for this visit. , BMI There is no  height or weight on file to calculate BMI. GENERAL:  Well appearing NECK:  No jugular venous distention, waveform within normal limits, carotid upstroke brisk and symmetric, no bruits, no thyromegaly LUNGS:  Clear to auscultation bilaterally CHEST:  Unremarkable HEART:  PMI not displaced or sustained,S1 and S2 within normal limits, no S3,  no clicks, no rubs, *** murmurs, irregular ABD:  Flat, positive bowel sounds normal in frequency in pitch, no bruits, no rebound, no guarding, no midline pulsatile mass, no hepatomegaly, no splenomegaly EXT:  2 plus pulses throughout, no edema, no cyanosis no clubbing   ***GENERAL:  Well appearing HEENT:  Pupils equal round and reactive, fundi not visualized, oral mucosa unremarkable NECK:  No jugular venous distention, waveform within normal limits, carotid upstroke brisk and symmetric, no bruits, no thyromegaly LYMPHATICS:  No cervical, inguinal adenopathy LUNGS:  Clear to auscultation bilaterally BACK:  No CVA tenderness CHEST:  Unremarkable HEART:  PMI not displaced or sustained,S1 and S2 within normal limits, no S3, no S4, no clicks, no rubs, *** murmurs ABD:  Flat, positive bowel sounds normal in frequency in pitch, no bruits, no rebound, no guarding, no midline pulsatile mass, no hepatomegaly, no splenomegaly  EXT:  2 plus pulses throughout, no edema, no cyanosis no clubbing SKIN:  No rashes no nodules NEURO:  Cranial nerves II through XII grossly intact, motor grossly intact throughout PSYCH:  Cognitively intact, oriented to person place and time    EKG:  EKG {ACTION; IS/IS [id number] ordered today. The ekg ordered today demonstrates ***   Recent Labs: No results found for requested labs within last 8760 hours.    Lipid Panel No results found for: CHOL, TRIG, HDL, CHOLHDL, VLDL, LDLCALC, LDLDIRECT    Wt Readings from Last 3 Encounters:  03/07/19 149 lb (67.6 kg)  10/26/17 148 lb (67.1 kg)  03/09/17 141 lb (64 kg)      Other  studies Reviewed: Additional studies/ records that were reviewed today include: ***. Review of the above records demonstrates:  Please see elsewhere in the note.  ***   ASSESSMENT AND PLAN:  ATRIAL FIB: Debra Chen has a CHA2DS2 - VASc score of ***.  ***  AAA:In May 2019 was2.9 cm x 3.5 cm.  ***  I will follow this again in the summer next year.   [id number] was mild in June of 2018.  She had moderate TR. ***   I will follow this clinically.   DYSLIPIDEMIA:  ***   She questioned whether she should remain on the statin.  I suggested that she should.  Her lipids last year were excellent.  She has atherosclerotic disease in her aorta.  She tolerates the statin.  There is benefit.  I will check a lipid profile liver enzymes in a couple of months.  I will try to get the records from her primary office.  She will remain on the meds as listed.  COVID EDUCATION:  ***  Current medicines are reviewed at length with the patient today.  The patient {ACTIONS; HAS/DOES NOT HAVE:19233} concerns regarding medicines.  The following changes have been made:  {PLAN; NO CHANGE:13088:s}  Labs/ tests ordered today include: *** No orders of the defined types were placed in this encounter.    Disposition:   FU with ***    Signed, Minus Breeding, MD  03/11/2020 8:29 PM    [facility]

## 2020-03-12 ENCOUNTER — Ambulatory Visit: Payer: Medicare HMO | Admitting: Cardiology

## 2020-03-12 ENCOUNTER — Encounter: Payer: Self-pay | Admitting: Cardiology

## 2020-03-18 DIAGNOSIS — I361 Nonrheumatic tricuspid (valve) insufficiency: Secondary | ICD-10-CM | POA: Insufficient documentation

## 2020-03-18 NOTE — Progress Notes (Signed)
Cardiology Office Note   Date:  03/19/2020   ID:  Debra Chen, Debra Chen 1926/02/02, MRN [id number]  PCP:  Debra Melter, MD  Cardiologist:   Debra Chen.   Chief Complaint  Patient presents with  . Atrial Fibrillation      History of Present Illness: Debra Chen is a 86 y.o. female who presents for follow up of atrial fib.  Since I last saw him her she has done well.  She denies any cardiovascular symptoms other than her heart racing occasionally in the morning.  She had Debra presyncope or syncope.  She denies any chest pressure, neck or arm discomfort.  She has had Debra weight gain or edema.   Past Medical History:  Diagnosis Date  . HTN (hypertension)    x 20 years  . MR (mitral regurgitation)    mod  . PAF (paroxysmal atrial fibrillation) ([place])   . TR (tricuspid regurgitation)    mod     Past Surgical History:  Procedure Laterality Date  . TUBAL LIGATION       Current Outpatient Medications  Medication Sig Dispense Refill  . atorvastatin (LIPITOR) 20 MG tablet Take 20 mg by mouth daily.    . benazepril (LOTENSIN) 20 MG tablet Take 20 mg by mouth daily.     . Calcium Carbonate (CALCIUM 600) 1500 MG TABS Take 1 tablet by mouth daily.      . furosemide (LASIX) 20 MG tablet TAKE 1/2 TABLET EVERY DAY AS NEEDED 45 tablet 3  . gabapentin (NEURONTIN) 100 MG capsule 100 mg. Take 2 tablets by mouth daily    . HYDROcodone-acetaminophen (VICODIN) 5-500 MG per tablet Take 1 tablet by mouth every 6 (six) hours as needed.      Debra Chen 15 MG TABS tablet Take 15 mg by mouth at bedtime.      Debra current facility-administered medications for this visit.    Allergies:   Sulfonamide derivatives   ROS:  Please see the history of present illness.   Otherwise, review of systems are positive for none.   All other systems are reviewed and negative.    PHYSICAL EXAM: VS:  BP (!) 142/84   Pulse 65   Ht 5' 3.5" (1.613 m)   Wt 154 lb (69.9 kg)   BMI 26.85  kg/m  , BMI Body mass index is 26.85 kg/m. GENERAL:  Well appearing NECK:  Debra jugular venous distention, waveform within normal limits, carotid upstroke brisk and symmetric, Debra bruits, Debra thyromegaly LUNGS:  Clear to auscultation bilaterally CHEST:  Unremarkable HEART:  PMI not displaced or sustained,S1 and S2 within normal limits, Debra S3,  Debra clicks, Debra rubs, 2 out of 6 apical systolic murmur radiating slightly at the aortic outflow tract, Debra diastolic murmurs, irregular ABD:  Flat, positive bowel sounds normal in frequency in pitch, Debra bruits, Debra rebound, Debra guarding, Debra midline pulsatile mass, Debra hepatomegaly, Debra splenomegaly EXT:  2 plus pulses throughout, trace edema, Debra cyanosis Debra clubbing   EKG:  EKG is ordered today. The ekg ordered today demonstrates atrial fibrillation, rate 65, axis within normal limits, intervals within normal limits, poor anterior R wave position, cannot exclude old anteroseptal infarct.  There are nonspecific lateral T wave inversions.  Debra change from previous.   Recent Labs: Debra results found for requested labs within last 8760 hours.    Lipid Panel Debra results found for: CHOL, TRIG, HDL, CHOLHDL, VLDL, LDLCALC, LDLDIRECT  Wt Readings from Last 3 Encounters:  03/19/20 154 lb (69.9 kg)  03/07/19 149 lb (67.6 kg)  10/26/17 148 lb (67.1 kg)      Other studies Reviewed: Additional studies/ records that were reviewed today include: Labs. Review of the above records demonstrates:  Please see elsewhere in the note.     ASSESSMENT AND PLAN:  ATRIAL FIB: Debra Chen has a CHA2DS2 - VASc score of 4.  She tolerates anticoagulation.   Debra change in therapy.   I will obtain records of her last CBC.    AAA:In May 2019 was2.9 cm x 3.5 cm.  I will get a repeat ultrasound next year.   [id number] was mild in June of 2018.  She had moderate TR.  I will follow this clinically.  DYSLIPIDEMIA:   I will look at those most recent results when  available.  COVID EDUCATION: She has had her vaccine.  Current medicines are reviewed at length with the patient today.  The patient does not have concerns regarding medicines.  The following changes have been made:  Debra change  Labs/ tests ordered today include: None  Orders Placed This Encounter  Procedures  . EKG 12-Lead     Disposition:   FU with me in one year.     Signed, Minus Breeding, MD  03/19/2020 4:45 PM    [facility]

## 2020-03-19 ENCOUNTER — Encounter: Payer: Self-pay | Admitting: Cardiology

## 2020-03-19 ENCOUNTER — Ambulatory Visit (INDEPENDENT_AMBULATORY_CARE_PROVIDER_SITE_OTHER): Payer: Medicare HMO | Admitting: Cardiology

## 2020-03-19 ENCOUNTER — Other Ambulatory Visit: Payer: Self-pay

## 2020-03-19 VITALS — BP 142/84 | HR 65 | Ht 63.5 in | Wt 154.0 lb

## 2020-03-19 DIAGNOSIS — I714 Abdominal aortic aneurysm, without rupture, unspecified: Secondary | ICD-10-CM

## 2020-03-19 DIAGNOSIS — Z7189 Other specified counseling: Secondary | ICD-10-CM | POA: Diagnosis not present

## 2020-03-19 DIAGNOSIS — I080 Rheumatic disorders of both mitral and aortic valves: Secondary | ICD-10-CM | POA: Diagnosis not present

## 2020-03-19 DIAGNOSIS — I361 Nonrheumatic tricuspid (valve) insufficiency: Secondary | ICD-10-CM | POA: Diagnosis not present

## 2020-03-19 DIAGNOSIS — E785 Hyperlipidemia, unspecified: Secondary | ICD-10-CM

## 2020-03-19 DIAGNOSIS — I482 Chronic atrial fibrillation, unspecified: Secondary | ICD-10-CM

## 2020-03-19 NOTE — Patient Instructions (Signed)
Medication Instructions:  The current medical regimen is effective;  continue present plan and medications.  *If you need a refill on your cardiac medications before your next appointment, please call your pharmacy*  Follow-Up: At [facility], you and your health needs are our priority.  As part of our continuing mission to provide you with exceptional heart care, we have created designated Provider Care Teams.  These Care Teams include your primary Cardiologist (physician) and Advanced Practice Providers (APPs -  Physician Assistants and Nurse Practitioners) who all work together to provide you with the care you need, when you need it.  We recommend signing up for the patient portal called "MyChart".  Sign up information is provided on this After Visit Summary.  MyChart is used to connect with patients for Virtual Visits (Telemedicine).  Patients are able to view lab/test results, encounter notes, upcoming appointments, etc.  Non-urgent messages can be sent to your provider as well.   To learn more about what you can do with MyChart, go to [email or web address].    Your next appointment:   12 month(s)  The format for your next appointment:   In Person  Provider:   Minus Breeding, MD  .heartop

## 2020-03-24 ENCOUNTER — Telehealth: Payer: Self-pay | Admitting: Cardiology

## 2020-03-24 NOTE — Telephone Encounter (Signed)
Discussed AAA with , no further questions at this time

## 2020-03-24 NOTE — Telephone Encounter (Signed)
Patient would like to speak with Dr. Percival Spanish in regards to her after visit summary from her last OV on 03/19/20. She states that there is something in there that Dr. Percival Spanish did not address with her and it involves her stomach. She is aware that Dr. Percival Spanish is out of the office this afternoon but will be back in tomorrow.

## 2020-04-10 DIAGNOSIS — R300 Dysuria: Secondary | ICD-10-CM | POA: Diagnosis not present

## 2020-04-21 DIAGNOSIS — E1142 Type 2 diabetes mellitus with diabetic polyneuropathy: Secondary | ICD-10-CM | POA: Diagnosis not present

## 2020-04-21 DIAGNOSIS — Z0000 Encounter for general adult medical examination without abnormal findings: Secondary | ICD-10-CM | POA: Diagnosis not present

## 2020-04-21 DIAGNOSIS — E1122 Type 2 diabetes mellitus with diabetic chronic kidney disease: Secondary | ICD-10-CM | POA: Diagnosis not present

## 2020-04-21 DIAGNOSIS — I482 Chronic atrial fibrillation, unspecified: Secondary | ICD-10-CM | POA: Diagnosis not present

## 2020-04-21 DIAGNOSIS — R011 Cardiac murmur, unspecified: Secondary | ICD-10-CM | POA: Diagnosis not present

## 2020-04-21 DIAGNOSIS — L989 Disorder of the skin and subcutaneous tissue, unspecified: Secondary | ICD-10-CM | POA: Diagnosis not present

## 2020-04-21 DIAGNOSIS — E7800 Pure hypercholesterolemia, unspecified: Secondary | ICD-10-CM | POA: Diagnosis not present

## 2020-05-05 DIAGNOSIS — M25562 Pain in left knee: Secondary | ICD-10-CM | POA: Diagnosis not present

## 2020-05-05 DIAGNOSIS — M25561 Pain in right knee: Secondary | ICD-10-CM | POA: Diagnosis not present

## 2020-05-05 DIAGNOSIS — G8929 Other chronic pain: Secondary | ICD-10-CM | POA: Diagnosis not present

## 2020-05-05 DIAGNOSIS — M170 Bilateral primary osteoarthritis of knee: Secondary | ICD-10-CM | POA: Diagnosis not present

## 2020-05-19 DIAGNOSIS — L570 Actinic keratosis: Secondary | ICD-10-CM | POA: Diagnosis not present

## 2020-05-19 DIAGNOSIS — L578 Other skin changes due to chronic exposure to nonionizing radiation: Secondary | ICD-10-CM | POA: Diagnosis not present

## 2020-05-19 DIAGNOSIS — C44329 Squamous cell carcinoma of skin of other parts of face: Secondary | ICD-10-CM | POA: Diagnosis not present

## 2020-05-19 DIAGNOSIS — L821 Other seborrheic keratosis: Secondary | ICD-10-CM | POA: Diagnosis not present

## 2020-06-02 DIAGNOSIS — D0439 Carcinoma in situ of skin of other parts of face: Secondary | ICD-10-CM | POA: Diagnosis not present

## 2020-07-22 ENCOUNTER — Telehealth: Payer: Self-pay | Admitting: Cardiology

## 2020-07-22 DIAGNOSIS — R52 Pain, unspecified: Secondary | ICD-10-CM | POA: Diagnosis not present

## 2020-07-22 DIAGNOSIS — I10 Essential (primary) hypertension: Secondary | ICD-10-CM | POA: Diagnosis not present

## 2020-07-22 DIAGNOSIS — E1122 Type 2 diabetes mellitus with diabetic chronic kidney disease: Secondary | ICD-10-CM | POA: Diagnosis not present

## 2020-07-22 DIAGNOSIS — J9601 Acute respiratory failure with hypoxia: Secondary | ICD-10-CM | POA: Diagnosis not present

## 2020-07-22 DIAGNOSIS — I447 Left bundle-branch block, unspecified: Secondary | ICD-10-CM | POA: Diagnosis not present

## 2020-07-22 DIAGNOSIS — R7989 Other specified abnormal findings of blood chemistry: Secondary | ICD-10-CM | POA: Diagnosis not present

## 2020-07-22 DIAGNOSIS — R1084 Generalized abdominal pain: Secondary | ICD-10-CM | POA: Diagnosis not present

## 2020-07-22 DIAGNOSIS — E785 Hyperlipidemia, unspecified: Secondary | ICD-10-CM | POA: Diagnosis not present

## 2020-07-22 DIAGNOSIS — I4891 Unspecified atrial fibrillation: Secondary | ICD-10-CM | POA: Diagnosis not present

## 2020-07-22 DIAGNOSIS — I517 Cardiomegaly: Secondary | ICD-10-CM | POA: Diagnosis not present

## 2020-07-22 DIAGNOSIS — I130 Hypertensive heart and chronic kidney disease with heart failure and stage 1 through stage 4 chronic kidney disease, or unspecified chronic kidney disease: Secondary | ICD-10-CM | POA: Diagnosis not present

## 2020-07-22 DIAGNOSIS — R778 Other specified abnormalities of plasma proteins: Secondary | ICD-10-CM | POA: Diagnosis not present

## 2020-07-22 DIAGNOSIS — I272 Pulmonary hypertension, unspecified: Secondary | ICD-10-CM | POA: Diagnosis not present

## 2020-07-22 DIAGNOSIS — R0602 Shortness of breath: Secondary | ICD-10-CM | POA: Diagnosis not present

## 2020-07-22 DIAGNOSIS — N179 Acute kidney failure, unspecified: Secondary | ICD-10-CM | POA: Diagnosis not present

## 2020-07-22 DIAGNOSIS — I714 Abdominal aortic aneurysm, without rupture: Secondary | ICD-10-CM | POA: Diagnosis not present

## 2020-07-22 DIAGNOSIS — I081 Rheumatic disorders of both mitral and tricuspid valves: Secondary | ICD-10-CM | POA: Diagnosis not present

## 2020-07-22 DIAGNOSIS — I509 Heart failure, unspecified: Secondary | ICD-10-CM | POA: Diagnosis not present

## 2020-07-22 DIAGNOSIS — Z20822 Contact with and (suspected) exposure to covid-19: Secondary | ICD-10-CM | POA: Diagnosis not present

## 2020-07-22 DIAGNOSIS — I4811 Longstanding persistent atrial fibrillation: Secondary | ICD-10-CM | POA: Diagnosis not present

## 2020-07-22 DIAGNOSIS — I5043 Acute on chronic combined systolic (congestive) and diastolic (congestive) heart failure: Secondary | ICD-10-CM | POA: Diagnosis not present

## 2020-07-22 DIAGNOSIS — N1832 Chronic kidney disease, stage 3b: Secondary | ICD-10-CM | POA: Diagnosis not present

## 2020-07-22 NOTE — Telephone Encounter (Signed)
Called patient daughter- she advised that mother was at ED (visit and notes showing in care everywhere) just wanted to know what her blood pressure readings were here in office at last visit. Gave information patient daughter thankful for call back.  Advised I would route to MD to make aware.

## 2020-07-22 NOTE — Telephone Encounter (Signed)
Patient's daughter is calling to let us know the ambulance just took her mother to [facility].  She wants to know what her mother BP and HR was the last time she was in the office.

## 2020-07-23 DIAGNOSIS — I509 Heart failure, unspecified: Secondary | ICD-10-CM | POA: Diagnosis not present

## 2020-07-23 DIAGNOSIS — N1832 Chronic kidney disease, stage 3b: Secondary | ICD-10-CM | POA: Diagnosis not present

## 2020-07-23 DIAGNOSIS — R778 Other specified abnormalities of plasma proteins: Secondary | ICD-10-CM | POA: Diagnosis not present

## 2020-07-23 DIAGNOSIS — J9601 Acute respiratory failure with hypoxia: Secondary | ICD-10-CM | POA: Diagnosis not present

## 2020-07-23 DIAGNOSIS — R7989 Other specified abnormal findings of blood chemistry: Secondary | ICD-10-CM | POA: Diagnosis not present

## 2020-07-23 DIAGNOSIS — E785 Hyperlipidemia, unspecified: Secondary | ICD-10-CM | POA: Diagnosis not present

## 2020-07-23 DIAGNOSIS — I10 Essential (primary) hypertension: Secondary | ICD-10-CM | POA: Diagnosis not present

## 2020-07-23 DIAGNOSIS — I517 Cardiomegaly: Secondary | ICD-10-CM | POA: Diagnosis not present

## 2020-07-23 DIAGNOSIS — I714 Abdominal aortic aneurysm, without rupture: Secondary | ICD-10-CM | POA: Diagnosis not present

## 2020-07-23 DIAGNOSIS — I4811 Longstanding persistent atrial fibrillation: Secondary | ICD-10-CM | POA: Diagnosis not present

## 2020-07-23 DIAGNOSIS — I447 Left bundle-branch block, unspecified: Secondary | ICD-10-CM | POA: Diagnosis not present

## 2020-07-23 DIAGNOSIS — I083 Combined rheumatic disorders of mitral, aortic and tricuspid valves: Secondary | ICD-10-CM | POA: Diagnosis not present

## 2020-07-24 DIAGNOSIS — D72829 Elevated white blood cell count, unspecified: Secondary | ICD-10-CM | POA: Diagnosis not present

## 2020-07-24 DIAGNOSIS — I447 Left bundle-branch block, unspecified: Secondary | ICD-10-CM | POA: Diagnosis not present

## 2020-07-24 DIAGNOSIS — I872 Venous insufficiency (chronic) (peripheral): Secondary | ICD-10-CM | POA: Diagnosis not present

## 2020-07-24 DIAGNOSIS — N3000 Acute cystitis without hematuria: Secondary | ICD-10-CM | POA: Diagnosis not present

## 2020-07-24 DIAGNOSIS — I714 Abdominal aortic aneurysm, without rupture: Secondary | ICD-10-CM | POA: Diagnosis not present

## 2020-07-24 DIAGNOSIS — I10 Essential (primary) hypertension: Secondary | ICD-10-CM | POA: Diagnosis not present

## 2020-07-24 DIAGNOSIS — I4811 Longstanding persistent atrial fibrillation: Secondary | ICD-10-CM | POA: Diagnosis not present

## 2020-07-24 DIAGNOSIS — E785 Hyperlipidemia, unspecified: Secondary | ICD-10-CM | POA: Diagnosis not present

## 2020-07-24 DIAGNOSIS — J9601 Acute respiratory failure with hypoxia: Secondary | ICD-10-CM | POA: Diagnosis not present

## 2020-07-28 ENCOUNTER — Telehealth: Payer: Self-pay | Admitting: Cardiology

## 2020-07-28 NOTE — Telephone Encounter (Signed)
Pt's daughter wants to know if Dr. Percival Spanish and put in a request for pt to have a stress test. Please call to discuss

## 2020-07-28 NOTE — Telephone Encounter (Signed)
Called patient daughter, she advised when she took her to [facility] on 09/19 they suggested outpatient stress test, but patient wanted it through [place].   Okay to order and have her set up to do this and then to see you?   Thank you!

## 2020-07-29 NOTE — Telephone Encounter (Signed)
I reviewed the records from [place] and I would prefer to see her back in the office before considering a stress test. Please schedule at my next open appt.

## 2020-07-31 DIAGNOSIS — I480 Paroxysmal atrial fibrillation: Secondary | ICD-10-CM | POA: Diagnosis not present

## 2020-07-31 DIAGNOSIS — I340 Nonrheumatic mitral (valve) insufficiency: Secondary | ICD-10-CM | POA: Diagnosis not present

## 2020-07-31 DIAGNOSIS — I5023 Acute on chronic systolic (congestive) heart failure: Secondary | ICD-10-CM | POA: Diagnosis not present

## 2020-07-31 DIAGNOSIS — R410 Disorientation, unspecified: Secondary | ICD-10-CM | POA: Diagnosis not present

## 2020-07-31 DIAGNOSIS — N390 Urinary tract infection, site not specified: Secondary | ICD-10-CM | POA: Diagnosis not present

## 2020-07-31 NOTE — Telephone Encounter (Signed)
No answer

## 2020-07-31 NOTE — Telephone Encounter (Signed)
I agree with this low dose beta blocker

## 2020-07-31 NOTE — Telephone Encounter (Signed)
Advised daughter 

## 2020-07-31 NOTE — Telephone Encounter (Signed)
Spoke with daughter regarding appointment.  She is concerned that patient was started on Metoprolol Succ 25 mg daily when d/c from hospital Concerned about elderly person starting on new medication Did ask daughter if patient went to hospital with elevated heart rate Patient did go in with elevated HR. Explained this was likely given for rate control but is a low dose  She would like for Dr Percival Spanish to review, will forward as requested  Patient has appointment 08/08/2020

## 2020-08-07 NOTE — Progress Notes (Signed)
Cardiology Office Note   Date:  08/08/2020   ID:  Uchenna, Seufert Apr 17, 1926, MRN [id number]  PCP:  Orpah Melter, MD  Cardiologist:   Minus Breeding, MD   Chief Complaint  Patient presents with  . Shortness of Breath      History of Present Illness: KORENE DULA is a 86 y.o. female who presents for follow up of atrial fib.  Since I last saw him her she was admitted with acute on chronic diastolic heart failure.  I reviewed these records for this visit.  She had 24 hours of worsening shortness of breath.  She was tachycardic.  Heart rate was 100-1 tens.  This was atrial fibrillation.  BNP was 17,000.  Troponin was mildly elevated but the delta was negative.  Chest x-ray had some acute interstitial edema.  She had new systolic dysfunction with an EF of 35 to 40%.  It was suggested that she have a perfusion study which we can consider doing as an outpatient.  All of this was done at [facility] and I reviewed these records through [place].   Of note benazepril HCT was discontinued.  She was treated with antibiotics.  She was started on a low-dose of beta-blocker because of tachycardia.  Of note her daughter did call concerned about starting this.  She has been doing relatively well at home.  She is not been acutely short of breath.  She is not feeling any palpitations.  She said no presyncope or syncope.  Her family is worried about her decreased memory.   Past Medical History:  Diagnosis Date  . HTN (hypertension)    x 20 years  . MR (mitral regurgitation)    mod  . PAF (paroxysmal atrial fibrillation) ([place])   . TR (tricuspid regurgitation)    mod     Past Surgical History:  Procedure Laterality Date  . TUBAL LIGATION       Current Outpatient Medications  Medication Sig Dispense Refill  . atorvastatin (LIPITOR) 20 MG tablet Take 20 mg by mouth daily.    . Calcium Carbonate (CALCIUM 600) 1500 MG TABS Take 1 tablet by mouth daily.      . cefdinir (OMNICEF)  300 MG capsule Take 300 mg by mouth 2 (two) times daily.    . furosemide (LASIX) 20 MG tablet Take 20 mg by mouth daily.    Marland Kitchen gabapentin (NEURONTIN) 100 MG capsule 100 mg. Take 2 tablets by mouth daily    . HYDROcodone-acetaminophen (VICODIN) 5-500 MG per tablet Take 1 tablet by mouth every 6 (six) hours as needed.      . metoprolol succinate (TOPROL-XL) 25 MG 24 hr tablet Take 1 tablet by mouth daily.    Alveda Reasons 15 MG TABS tablet Take 15 mg by mouth at bedtime.     . benazepril (LOTENSIN) 5 MG tablet Take 1 tablet (5 mg total) by mouth daily. 30 tablet 11   No current facility-administered medications for this visit.    Allergies:   Sulfonamide derivatives   ROS:  Please see the history of present illness.   Otherwise, review of systems are positive for none.   All other systems are reviewed and negative.    PHYSICAL EXAM: VS:  BP 118/64   Pulse 68   Ht 5\' 4"  (1.626 m)   Wt 144 lb 3.2 oz (65.4 kg)   BMI 24.75 kg/m  , BMI Body mass index is 24.75 kg/m. GENERAL:  Well appearing NECK:  No jugular venous distention, waveform within normal limits, carotid upstroke brisk and symmetric, no bruits, no thyromegaly LUNGS:  Clear to auscultation bilaterally CHEST:  Unremarkable HEART:  PMI not displaced or sustained,S1 and S2 within normal limits, no S3, no clicks, no rubs, 2 out of 6 apical systolic murmur radiating out aortic outflow tract, no diastolic murmurs, irregular ABD:  Flat, positive bowel sounds normal in frequency in pitch, no bruits, no rebound, no guarding, no midline pulsatile mass, no hepatomegaly, no splenomegaly EXT:  2 plus pulses throughout, mild right leg edema, no cyanosis no clubbing   EKG:  EKG is  ordered today. The ekg ordered today demonstrates atrial fibrillation, rate 68, axis within normal limits, intervals within normal limits, poor anterior R wave position, cannot exclude old anteroseptal infarct.  There are nonspecific lateral T wave inversions.  No change  from previous.   Recent Labs: No results found for requested labs within last 8760 hours.    Lipid Panel No results found for: CHOL, TRIG, HDL, CHOLHDL, VLDL, LDLCALC, LDLDIRECT    Wt Readings from Last 3 Encounters:  08/08/20 144 lb 3.2 oz (65.4 kg)  03/19/20 154 lb (69.9 kg)  03/07/19 149 lb (67.6 kg)      Other studies Reviewed: Additional studies/ records that were reviewed today include: Labs.  Greater than 40 minutes with and the charts more than half the time with the patient Review of the above records demonstrates:  Please see elsewhere in the note.     ASSESSMENT AND PLAN:  ATRIAL FIB: Ms. MICHALINE KINDIG has a CHA2DS2 - VASc score of 4.  She is now on a low dose of beta blocker.  She tolerates anticoagulation.  No change in therapy.   CARDIOMYOPATHY: Not clear the etiology of this.  However, her family agrees on more conservative management.  I am going to restart a very low dose of Lotensin 5 mg daily.  She should get a basic metabolic profile again in 2 weeks.  I will probably repeat an echo in the weeks to come.  I am not planning an ischemia work-up at this point as she is having no chest pain.  AAA:In May 2019 was2.9 cm x 3.5 cm.   No further testing at this time.  LT:JQZE was mild in June of 2018.  She had moderate TR .  I will follow this up next year as described above.  AS: This was mild.  I will check an echo again in the months to come.  DYSLIPIDEMIA:   LDL 72 in February.  No change in therapy.  COVID EDUCATION: She has had her vaccine.  Current medicines are reviewed at length with the patient today.  The patient does not have concerns regarding medicines.  The following changes have been made:  None  Labs/ tests ordered today include:    Orders Placed This Encounter  Procedures  . Basic metabolic panel  . EKG 12-Lead     Disposition:   FU with APP in one month.    Signed, Minus Breeding, MD  08/08/2020 9:45 AM    [place]

## 2020-08-08 ENCOUNTER — Encounter: Payer: Self-pay | Admitting: Cardiology

## 2020-08-08 ENCOUNTER — Ambulatory Visit (INDEPENDENT_AMBULATORY_CARE_PROVIDER_SITE_OTHER): Payer: Medicare HMO | Admitting: Cardiology

## 2020-08-08 ENCOUNTER — Other Ambulatory Visit: Payer: Self-pay

## 2020-08-08 VITALS — BP 118/64 | HR 68 | Ht 64.0 in | Wt 144.2 lb

## 2020-08-08 DIAGNOSIS — I482 Chronic atrial fibrillation, unspecified: Secondary | ICD-10-CM

## 2020-08-08 DIAGNOSIS — I080 Rheumatic disorders of both mitral and aortic valves: Secondary | ICD-10-CM

## 2020-08-08 DIAGNOSIS — I361 Nonrheumatic tricuspid (valve) insufficiency: Secondary | ICD-10-CM

## 2020-08-08 DIAGNOSIS — I10 Essential (primary) hypertension: Secondary | ICD-10-CM

## 2020-08-08 MED ORDER — BENAZEPRIL HCL 5 MG PO TABS: 5.0000 mg | ORAL_TABLET | Freq: Every day | ORAL | 11 refills | Status: DC

## 2020-08-08 NOTE — Patient Instructions (Addendum)
Medication Instructions:   Start taking Benazepril 5 mg one tablet daily   *If you need a refill on your cardiac medications before your next appointment, please call your pharmacy*   Lab Work: BMP in 2 week  After starting the Benazepril  5 mg  Daily    If you have labs (blood work) drawn today and your tests are completely normal, you will receive your results only by: Marland Kitchen MyChart Message (if you have MyChart) OR . A paper copy in the mail If you have any lab test that is abnormal or we need to change your treatment, we will call you to review the results.   Testing/Procedures: not needed   Follow-Up: At [facility], you and your health needs are our priority.  As part of our continuing mission to provide you with exceptional heart care, we have created designated Provider Care Teams.  These Care Teams include your primary Cardiologist (physician) and Advanced Practice Providers (APPs -  Physician Assistants and Nurse Practitioners) who all work together to provide you with the care you need, when you need it.  We recommend signing up for the patient portal called "MyChart".  Sign up information is provided on this After Visit Summary.  MyChart is used to connect with patients for Virtual Visits (Telemedicine).  Patients are able to view lab/test results, encounter notes, upcoming appointments, etc.  Non-urgent messages can be sent to your provider as well.   To learn more about what you can do with MyChart, go to [email or web address].    Your next appointment:   1 month(s)  The format for your next appointment:   In Person  Provider:   You will see one of the following Advanced Practice Providers on your designated Care Team:    Rosaria Ferries, PA-C  Jory Sims, DNP, ANP  Then, Minus Breeding, MD will plan to see you again in 4 month(s).   Other Instructions

## 2020-08-21 ENCOUNTER — Telehealth: Payer: Self-pay

## 2020-08-21 DIAGNOSIS — I10 Essential (primary) hypertension: Secondary | ICD-10-CM | POA: Diagnosis not present

## 2020-08-21 DIAGNOSIS — I361 Nonrheumatic tricuspid (valve) insufficiency: Secondary | ICD-10-CM | POA: Diagnosis not present

## 2020-08-21 DIAGNOSIS — I080 Rheumatic disorders of both mitral and aortic valves: Secondary | ICD-10-CM | POA: Diagnosis not present

## 2020-08-21 DIAGNOSIS — I482 Chronic atrial fibrillation, unspecified: Secondary | ICD-10-CM | POA: Diagnosis not present

## 2020-08-21 MED ORDER — METOPROLOL SUCCINATE ER 25 MG PO TB24: 25.0000 mg | ORAL_TABLET | Freq: Every day | ORAL | 0 refills | Status: DC

## 2020-08-21 NOTE — Telephone Encounter (Signed)
Went out to lobby while pt and daughter were here waiting for lab work to confirm which pharmacy pt would like her refill of metoprolol sent to. During this conversation pt's daughter described an incident of hypotension on Tuesday 10/12 in which the pt became symptomatic with some lightheadedness. Pt's blood pressure at that time was reported to be approximately 85/50. Hypotension resolved the next day. Pt is not experiencing any symptoms today.   Pt is currently compliant with all medications, including benazepril 5 mg daily, lasix 20 mg daily and metoprolol succinate 25 mg daily. Pt's daughter states that blood pressure is generally around 782 systolic, but admits that she does not check pt's BP daily. Pt also notes that she has some edema in her legs, but her daily weights have been consistent and she is not experiencing shortness of breath. Advised pt to take BP daily, make a record of it, and bring that information with the pt to her follow up appointment with Kerin Ransom, PA-C on 09/10/20. Additionally, advised pt to call our office with any abnormal readings or to report any unusual symptoms prior to her appointment.

## 2020-08-21 NOTE — Telephone Encounter (Signed)
Pt presents to the office today for lab work and inquired about getting a refill of her metoprolol succinate. One-month supply sent to pt's pharmacy

## 2020-08-21 NOTE — Addendum Note (Signed)
Addended by: Leotis Pain, [place] on: 08/21/2020 02:48 PM   Modules accepted: Orders

## 2020-08-22 LAB — BASIC METABOLIC PANEL
BUN/Creatinine Ratio: 16 (ref 12–28)
BUN: 19 mg/dL (ref 10–36)
CO2: 27 mmol/L (ref 20–29)
Calcium: 9.2 mg/dL (ref 8.7–10.3)
Chloride: 104 mmol/L (ref 96–106)
Creatinine, Ser: 1.16 mg/dL — ABNORMAL HIGH (ref 0.57–1.00)
GFR calc Af Amer: 47 mL/min/{1.73_m2} — ABNORMAL LOW (ref >59)
GFR calc non Af Amer: 41 mL/min/{1.73_m2} — ABNORMAL LOW (ref >59)
Glucose: 92 mg/dL (ref 65–99)
Potassium: 4.3 mmol/L (ref 3.5–5.2)
Sodium: 144 mmol/L (ref 134–144)

## 2020-08-23 NOTE — Telephone Encounter (Signed)
Agree with plan 

## 2020-08-25 ENCOUNTER — Encounter: Payer: Self-pay | Admitting: *Deleted

## 2020-08-29 ENCOUNTER — Telehealth: Payer: Self-pay | Admitting: Cardiology

## 2020-08-29 NOTE — Telephone Encounter (Signed)
Returned the call to the daughter. She stated that the patient has been having dizziness in the mornings which leads to vomiting. She also stated that the patient has lost hearing in one ear. She was wondering if this could be from the Metoprolol.   The daughter has been advised that the patient has been on the Metoprolol for over a month so these symptoms are most likely not from the medication.  She has been advised to call the patient's PCP because the patient could have an ear or sinus infection which could effect her equilibrium.   Blood pressure has been normal at 124/68.

## 2020-08-29 NOTE — Telephone Encounter (Signed)
Pt daughter called in stated pt is still having dizzy spells and was vomiting yesterday morning and this morning.  She has a piece of toast since then and was able to hold it down.  She states that also has a headache.  The headaches started yesterday.  Pt is dizzy every time she stands up.  They checked her bp and it was 124/68 this morning.  Pt  Denies any chest pain or SOB .    Best number [phone]

## 2020-08-30 NOTE — Telephone Encounter (Signed)
Agree 

## 2020-09-01 DIAGNOSIS — E119 Type 2 diabetes mellitus without complications: Secondary | ICD-10-CM | POA: Diagnosis not present

## 2020-09-01 DIAGNOSIS — Z8679 Personal history of other diseases of the circulatory system: Secondary | ICD-10-CM | POA: Diagnosis not present

## 2020-09-01 DIAGNOSIS — H9120 Sudden idiopathic hearing loss, unspecified ear: Secondary | ICD-10-CM | POA: Diagnosis not present

## 2020-09-01 NOTE — Telephone Encounter (Signed)
Left a message for the patient to call back.  

## 2020-09-02 NOTE — Telephone Encounter (Signed)
No answer when called. Unable to leave a message. Patient has an appointment on 11/3.

## 2020-09-03 ENCOUNTER — Ambulatory Visit: Payer: Medicare HMO | Admitting: Cardiology

## 2020-09-04 NOTE — Telephone Encounter (Signed)
Third attempt-encounter will be closed. Appointment 11/3

## 2020-09-05 ENCOUNTER — Telehealth: Payer: Self-pay | Admitting: Cardiology

## 2020-09-05 NOTE — Telephone Encounter (Signed)
Attempted to return call  Phone rang continuously with no answer

## 2020-09-05 NOTE — Telephone Encounter (Signed)
Pt c/o BP issue: STAT if pt c/o blurred vision, one-sided weakness or slurred speech  1. What are your last 5 BP readings? Per patient's daughter: 56-96/50-56 all day long yesterday 09/04/2020 Today is more normal 115/75  2. Are you having any other symptoms (ex. Dizziness, headache, blurred vision, passed out)? No   3. What is your BP issue?  Per patient's daughter Blood pressure has been really low over the last few days, she would like to know should she still give the patient her blood pressure medication when it is running so low. She spoke with pharmacist yesterday evening and he suggested that they hold the medication until she was able to speak with patient's cardiologist.   Please call/advise.   Thank you!

## 2020-09-09 NOTE — Progress Notes (Signed)
Cardiology Office Note  Date:  09/09/2020   ID:  Debra Chen, DOB 09-21-1926, MRN [id number]  PCP:  Orpah Melter, MD  Cardiologist:  Dr. Percival Spanish  _____________  1 month follow-up  _____________   History of Present Illness: Debra Chen is a 86 y.o. female with pmh of afib on Xarelto, chronic combined systolic and diastolic CHF, Cardiomyopathy (EF35-40% 07/2020), HTN, mild MR, and mod TR. She was hospitalized in September at [facility] for CHF and rapid afib. She was found to have new low EF to 35-40%. An OP perfusion study was recommended.   She was seen in hospital follow-up 08/08/20 and was overall doing well. Given that she was not having chest pain no plan for ischemic evaluation. Lotensin 5 ws started. She was tolerating BB and a/c for afib. Since the last visit the patient called to report hypotension and dizziness she was scheduled to be seen in the office.   Today, she is accompanied by her daughter. They report that a week ago the patient had a day of nausea and vomiting. SBP was noted to the in the 90s for next 3 days. BP meds were held. Patient has slowly recovered and feels back to her normal self. She is eating and drinking normally. BP has improved but does have occasional low readings. Today in the office 164/85. The last couple days the patient has taken her lotensin at night. During the day og N/V she felt her left ear pop and since then has not been able to hear in the left ear and dizziness. Dizziness seems to be positional, worse with laying down and movement of the head. She went to an Urgent care and they recommended she stop lasix incase of ototoxicity. Since then weights has slightly increased, 144lbs>149lbs. Dizziness and Bps have since gotten better however hearing has not. They are requesting ENT referral. She denies symptoms of palpitations, chest pain, shortness of breath, orthopnea, PND, lower extremity edema, claudication, presyncope, syncope,  bleeding, or neurologic sequela.      Past Medical History:  Diagnosis Date  . HTN (hypertension)    x 20 years  . MR (mitral regurgitation)    mod  . PAF (paroxysmal atrial fibrillation) ([place])   . TR (tricuspid regurgitation)    mod    Past Surgical History:  Procedure Laterality Date  . TUBAL LIGATION     _____________  Current Outpatient Medications  Medication Sig Dispense Refill  . atorvastatin (LIPITOR) 20 MG tablet Take 20 mg by mouth daily.    . benazepril (LOTENSIN) 5 MG tablet Take 1 tablet (5 mg total) by mouth daily. 30 tablet 11  . Calcium Carbonate (CALCIUM 600) 1500 MG TABS Take 1 tablet by mouth daily.      . cefdinir (OMNICEF) 300 MG capsule Take 300 mg by mouth 2 (two) times daily.    . furosemide (LASIX) 20 MG tablet Take 20 mg by mouth daily.    Marland Kitchen gabapentin (NEURONTIN) 100 MG capsule 100 mg. Take 2 tablets by mouth daily    . HYDROcodone-acetaminophen (VICODIN) 5-500 MG per tablet Take 1 tablet by mouth every 6 (six) hours as needed.      . metoprolol succinate (TOPROL-XL) 25 MG 24 hr tablet Take 1 tablet (25 mg total) by mouth daily. 30 tablet 0  . XARELTO 15 MG TABS tablet Take 15 mg by mouth at bedtime.      No current facility-administered medications for this visit.   _____________  Allergies:   Sulfonamide derivatives  _____________   Social History:  The patient  reports that she quit smoking about 48 years ago. She has never used smokeless tobacco. She reports that she does not drink alcohol and does not use drugs.  _____________   Family History:  The patient's family history is not on file.  _____________   ROS:  Please see the history of present illness.   Positive for occasional dizziness,   All other systems are reviewed and negative.  _____________   PHYSICAL EXAM: VS:  There were no vitals taken for this visit. , BMI There is no height or weight on file to calculate BMI. GEN: Well nourished, well developed, in no acute distress   HEENT: normal  Neck: no JVD, carotid bruits, or masses Cardiac: Irreg irreg; no murmurs, rubs, or gallops. No clubbing, cyanosis, edema.  Radials/DP/PT 2+ and equal bilaterally.  Respiratory:  clear to auscultation bilaterally, normal work of breathing GI: soft, nontender, nondistended, + BS MS: no deformity or atrophy  Skin: warm and dry, no rash Neuro:  Strength and sensation are intact Psych: euthymic mood, full affect _____________  EKG:   The ekg ordered today shows Rate controlled Afib, rates in the 70s  Recent Labs: 08/21/2020: BUN 19; Creatinine, Ser 1.16; Potassium 4.3; Sodium 144  No results found for requested labs within last 8760 hours.  CrCl cannot be calculated (Unknown ideal weight.).  Wt Readings from Last 3 Encounters:  08/08/20 144 lb 3.2 oz (65.4 kg)  03/19/20 154 lb (69.9 kg)  03/07/19 149 lb (67.6 kg)    Relevant Studies: Echo 07/23/20 LeftVentricle: Systolic function is moderately abnormal. EF: 35-40%.  . LeftVentricle: Wall motion abnormalities as outlined in graphic  representation.  . LeftVentricle: Moderately dilated left ventricular size.  Marland Kitchen LeftAtrium: Left atrium is moderately dilated. Left atrium volume  index is severely increased (>48 mL/m2).  . RightVentricle: Right ventricle is mildly dilated.  . RightAtrium: Right atrium is moderately dilated.  . AorticValve: The leaflets are mildly thickened and exhibit moderately  reduced excursion.  . AorticValve: There is mild to moderate stenosis, with peak and mean  gradients of 21.0 and 11.000 mmHg.  Marland Kitchen AorticValve: Trace regurgitation.  . MitralValve: There is mild posterior annular calcification.  . MitralValve: There is moderate regurgitation with a centrally directed  jet.  . TricuspidValve: Tricuspid valve structure is normal.  . TricuspidValve: There is mild to moderate regurgitation.  . TricuspidValve: The right ventricular systolic pressureis moderately   elevated (50-59 mmHg).  _____________   ASSESSMENT AND PLAN:  Hypotension with hx of hypertension Last week patient had episode of nausea and vomiting and low BP with SBP in the 90s for 3 days. Blood pressure meds were held. Since then pressures have improved (SBPs 120s) but still occasionally low numbers. Today it's 164/85. She has been taking lotensin at night. I would like to restart the Toprol-XL 25 however the daughter would feel more comfortable with a lower dose therefore will switch to metoprolol tartrate 12.5mg  BID. Instructed to not take BP meds if SBP<100.   Afib CHADSVASC at least 4. Anticoagulation with Xarelto. Rate controled. Will restart BB as above.   Cardiomyopathy/Chronic combined CHF Unclear etiology of this. In September 2021 EF found to be newly decreased at 35-40%. No report of chest ain After discussion with family no plan for ischemic evaluation. On Toprol-XL 25mg  daily, Lotensin 5mg  daily. Change Torpol to metoprolol tartrate 12.5mg  BID as above. Weight is up 5 lbs since  the last visit. Very mild lower leg edema. Do not want weight to continue to increase.Low suspicion that ear problems are caused by low dose lasix. Will restart lasix 20 mg. Continue with daily weights. Be cautious given lasix can lower BP as well.   Left ear issues/dizziness Last week during episode of nasuea/vomtinig left ear popped and has not been able to hear in the left hear. Also since then has been experiencing positional dizziness Dizziness was likely exacerbated by dehydration. This has slowly improved. Upon request will give referral to ENT. Also seeing PCP.  HLD Continue atorvastatin. LDL 72 in 2.2021  AAA May 2019 imaging showed 2.9cmx3.5cm  Mild MR/mod /mild ASTR Plan to follow with serial echos _____________    Disposition:   FU with Dr. Percival Spanish in 3 months   Signed, Sesar Madewell Ninfa Meeker, PA-C 09/09/2020 9:38 PM    _____________ [facility] [address] [place] [address]  [phone] (office) [phone] (fax)

## 2020-09-10 ENCOUNTER — Encounter: Payer: Self-pay | Admitting: Cardiology

## 2020-09-10 ENCOUNTER — Other Ambulatory Visit: Payer: Self-pay

## 2020-09-10 ENCOUNTER — Ambulatory Visit (INDEPENDENT_AMBULATORY_CARE_PROVIDER_SITE_OTHER): Payer: Medicare HMO | Admitting: Cardiology

## 2020-09-10 VITALS — BP 164/85 | HR 81 | Ht 64.0 in | Wt 149.2 lb

## 2020-09-10 DIAGNOSIS — H9192 Unspecified hearing loss, left ear: Secondary | ICD-10-CM | POA: Diagnosis not present

## 2020-09-10 DIAGNOSIS — I5042 Chronic combined systolic (congestive) and diastolic (congestive) heart failure: Secondary | ICD-10-CM

## 2020-09-10 DIAGNOSIS — E861 Hypovolemia: Secondary | ICD-10-CM

## 2020-09-10 DIAGNOSIS — I4891 Unspecified atrial fibrillation: Secondary | ICD-10-CM | POA: Diagnosis not present

## 2020-09-10 DIAGNOSIS — I9589 Other hypotension: Secondary | ICD-10-CM | POA: Diagnosis not present

## 2020-09-10 DIAGNOSIS — I429 Cardiomyopathy, unspecified: Secondary | ICD-10-CM

## 2020-09-10 DIAGNOSIS — R42 Dizziness and giddiness: Secondary | ICD-10-CM

## 2020-09-10 MED ORDER — METOPROLOL TARTRATE 25 MG PO TABS: 12.5000 mg | ORAL_TABLET | Freq: Two times a day (BID) | ORAL | 3 refills | Status: DC

## 2020-09-10 MED ORDER — METOPROLOL TARTRATE 25 MG PO TABS: 12.5000 mg | ORAL_TABLET | Freq: Two times a day (BID) | ORAL | 0 refills | Status: DC

## 2020-09-10 NOTE — Patient Instructions (Addendum)
Medication Instructions:   STOP Metoprolol Succinate (Toprol-XL)  START Metoprolol Tartrate (Lopressor) 12.5 mg 2 times a day  RESTART Lasix 20 mg daily  *If you need a refill on your cardiac medications before your next appointment, please call your pharmacy*  Lab Work: NONE ordered at this time of appointment   If you have labs (blood work) drawn today and your tests are completely normal, you will receive your results only by: Marland Kitchen MyChart Message (if you have MyChart) OR . A paper copy in the mail If you have any lab test that is abnormal or we need to change your treatment, we will call you to review the results.  Testing/Procedures: NONE ordered at this time of appointment   Follow-Up: At [facility], you and your health needs are our priority.  As part of our continuing mission to provide you with exceptional heart care, we have created designated Provider Care Teams.  These Care Teams include your primary Cardiologist (physician) and Advanced Practice Providers (APPs -  Physician Assistants and Nurse Practitioners) who all work together to provide you with the care you need, when you need it.  We recommend signing up for the patient portal called "MyChart".  Sign up information is provided on this After Visit Summary.  MyChart is used to connect with patients for Virtual Visits (Telemedicine).  Patients are able to view lab/test results, encounter notes, upcoming appointments, etc.  Non-urgent messages can be sent to your provider as well.   To learn more about what you can do with MyChart, go to [email or web address].    Your next appointment:   3 month(s)  The format for your next appointment:   In Person  Provider:   Minus Breeding, MD  Other Instructions  Monitor blood pressure daily at home

## 2020-09-12 ENCOUNTER — Other Ambulatory Visit: Payer: Self-pay | Admitting: Cardiology

## 2020-09-15 ENCOUNTER — Telehealth: Payer: Self-pay | Admitting: Cardiology

## 2020-09-15 DIAGNOSIS — H903 Sensorineural hearing loss, bilateral: Secondary | ICD-10-CM | POA: Diagnosis not present

## 2020-09-15 NOTE — Telephone Encounter (Signed)
Debra Chen Daughter called in and stated they just rec'd the new meds that were called in .  They are a little confused as to how she is suppose to be taking these meds. She thought she was suppose stop one of the meds   Best number  [phone] Or [phone]

## 2020-09-15 NOTE — Telephone Encounter (Signed)
Call back to patient's daughter Debra Chen, no DPR on file for Debra Chen, however patient was able to give verbal consent to speak with Debra Chen. Patient's daughter calling for clarification of medication dosage versus instructions received in the office.  Metoprolol succinate 25 mg qd changed after OV with APP to Metoprolol tartrate 12.5 mg bid per chart notes.  Lisinopril continued at 5 mg daily.  Instructions reviewed with patients daughter who states patient hasn't taken any metoprolol since OV on 09/10/2020 because mail order arrived today, and Bp has been 110's/60's. Discussed starting lower dose metoprolol as directed during OV, and checking BP once daily x5 days and call office to report BPs after 5th day unless the Systolic BP trends less than 100 or pt is symptomatic from low bp.  If BP<100 will hold lisinopril.  Patient's daughter verbalized understanding of instructions above.

## 2020-09-15 NOTE — Telephone Encounter (Addendum)
Spoke with pt's daughter, who states she just got off the phone with Georgana Curio, RN. States all of her questions have been answered to her satisfaction. Encouraged her to call back for any further questions or concerns.

## 2020-09-15 NOTE — Telephone Encounter (Addendum)
Returned call to patient's daughter Estill Bamberg, there was no DPR on file for Estill Bamberg however patient provided verbal consent to speak with Estill Bamberg.  Patient saw Cadence Furth PA on 09/10/2020 at time of OV patient had recently experienced nausea and vomiting. At the time of OV pt had BP 160's/80's, patients daughter question accuracy of BP "taken over patient's sweater."  Metoprolol succinate dose decreased to Metoprolol succinate 12.5 mg bid.  Patient received new metoprolol in the mail today and hasn't taken any since OV 09/10/2020.  Daughter calling for clarification of medication orders and reports patient's bp without metoprolol has been in the 110's/60's.  Instructed Estill Bamberg to have patient start new metoprolol as directed during OV, and check patient's Bp once daily x 5 days.  Hold lotensin as directed during OV if Bp <100.  Estill Bamberg will contact office to report Bps after 5 days or if she has additional concerns or patient's symptoms return.   Appt scheduled with ENT on 09/18/2020, appt with Dr Percival Spanish in February.

## 2020-09-18 ENCOUNTER — Other Ambulatory Visit: Payer: Self-pay

## 2020-09-18 ENCOUNTER — Ambulatory Visit (INDEPENDENT_AMBULATORY_CARE_PROVIDER_SITE_OTHER): Payer: Medicare HMO | Admitting: Otolaryngology

## 2020-09-18 VITALS — Temp 96.8°F

## 2020-09-18 DIAGNOSIS — H90A22 Sensorineural hearing loss, unilateral, left ear, with restricted hearing on the contralateral side: Secondary | ICD-10-CM

## 2020-09-18 DIAGNOSIS — H9122 Sudden idiopathic hearing loss, left ear: Secondary | ICD-10-CM | POA: Diagnosis not present

## 2020-09-18 NOTE — Progress Notes (Signed)
HPI: Debra Chen is a 86 y.o. female who presents for evaluation of sudden loss of hearing in the left ear.  Apparently patient heard a pop in her head and noticed decreased hearing in the left ear associated with some dizziness.  This occurred about three weeks ago.  She presents today with her daughter.  She had a hearing test from [place] [facility] that showed mild to moderate downsloping sensorineural hearing loss in the right ear.  However the hearing in the left ear pure-tone's was down at 100 DB.  She had type A tympanograms bilaterally. Patient is on Xarelto.  Past Medical History:  Diagnosis Date  . HTN (hypertension)    x 20 years  . MR (mitral regurgitation)    mod  . PAF (paroxysmal atrial fibrillation) ([place])   . TR (tricuspid regurgitation)    mod    Past Surgical History:  Procedure Laterality Date  . TUBAL LIGATION     Social History   Socioeconomic History  . Marital status: Single    Spouse name: Not on file  . Number of children: Not on file  . Years of education: Not on file  . Highest education level: Not on file  Occupational History  . Not on file  Tobacco Use  . Smoking status: Former Smoker    Quit date: 05/10/1972    Years since quitting: 48.3  . Smokeless tobacco: Never Used  . Tobacco comment: smoked 1 ppd for 20 years; quit in 1989  Substance and Sexual Activity  . Alcohol use: No  . Drug use: No  . Sexual activity: Not on file  Other Topics Concern  . Not on file  Social History Narrative   [employer]or pro tem of [place].    Social Determinants of Health   Financial Resource Strain:   . Difficulty of Paying Living Expenses: Not on file  Food Insecurity:   . Worried About Charity fundraiser in the Last Year: Not on file  . Ran Out of Food in the Last Year: Not on file  Transportation Needs:   . Lack of Transportation (Medical): Not on file  . Lack of Transportation (Non-Medical): Not on file  Physical Activity:   . Days of Exercise per  Week: Not on file  . Minutes of Exercise per Session: Not on file  Stress:   . Feeling of Stress : Not on file  Social Connections:   . Frequency of Communication with Friends and Family: Not on file  . Frequency of Social Gatherings with Friends and Family: Not on file  . Attends Religious Services: Not on file  . Active Member of Clubs or Organizations: Not on file  . Attends Archivist Meetings: Not on file  . Marital Status: Not on file   No family history on file. Allergies  Allergen Reactions  . Sulfonamide Derivatives    Prior to Admission medications   Medication Sig Start Date End Date Taking? Authorizing Provider  atorvastatin (LIPITOR) 20 MG tablet Take 20 mg by mouth daily.   Yes [provider]  benazepril (LOTENSIN) 5 MG tablet Take 1 tablet (5 mg total) by mouth daily. 08/08/20  Yes Minus Breeding, MD  Calcium Carbonate (CALCIUM 600) 1500 MG TABS Take 1 tablet by mouth daily.     Yes [provider]  cefdinir (OMNICEF) 300 MG capsule Take 300 mg by mouth 2 (two) times daily.   Yes [provider]  furosemide (LASIX) 20 MG tablet Take  20 mg by mouth daily.   Yes [provider]  gabapentin (NEURONTIN) 100 MG capsule 100 mg. Take 2 tablets by mouth daily   Yes [provider]  HYDROcodone-acetaminophen (VICODIN) 5-500 MG per tablet Take 1 tablet by mouth every 6 (six) hours as needed.     Yes [provider]  metoprolol succinate (TOPROL-XL) 25 MG 24 hr tablet TAKE 1 TABLET BY MOUTH EVERY DAY 09/12/20  Yes Minus Breeding, MD  metoprolol tartrate (LOPRESSOR) 25 MG tablet Take 0.5 tablets (12.5 mg total) by mouth 2 (two) times daily. 09/10/20  Yes Furth, Cadence H, PA-C  XARELTO 15 MG TABS tablet Take 15 mg by mouth at bedtime.  05/08/14  Yes [provider]     Positive ROS: Otherwise negative  All other systems have been reviewed and were otherwise negative with the exception of those mentioned in the  HPI and as above.  Physical Exam: Constitutional: Alert, well-appearing, no acute distress Ears: External ears without lesions or tenderness. Ear canals are clear bilaterally with intact, clear TMs bilaterally..  She is having no nystagmus or vertigo.  On tuning fork screening with the 512 tuning fork she was able to hear on the left side and I would estimate her hearing to be approximately 60-70 DB on the left side.  She was unable to hear the 1024 tuning fork on the left side. Nasal: External nose without lesions. Septum midline.  Both middle meatus regions were clear with no signs of infection.. Oral: Lips and gums without lesions. Tongue and palate mucosa without lesions. Posterior oropharynx clear. Neck: No palpable adenopathy or masses Respiratory: Breathing comfortably  Skin: No facial/neck lesions or rash noted.  Procedures  Assessment: Sudden left ear sensorineural hearing loss 3 weeks ago.  Plan: Placed her on steroids 60 mg x 4 days.  40 mg x 4 days and 20 mg x 4 days. She will follow-up in 2 weeks for recheck and audiologic testing.  Radene Journey, MD

## 2020-12-11 DIAGNOSIS — N183 Chronic kidney disease, stage 3 unspecified: Secondary | ICD-10-CM | POA: Diagnosis not present

## 2020-12-11 DIAGNOSIS — I10 Essential (primary) hypertension: Secondary | ICD-10-CM | POA: Diagnosis not present

## 2020-12-11 DIAGNOSIS — R300 Dysuria: Secondary | ICD-10-CM | POA: Diagnosis not present

## 2020-12-11 DIAGNOSIS — E1142 Type 2 diabetes mellitus with diabetic polyneuropathy: Secondary | ICD-10-CM | POA: Diagnosis not present

## 2020-12-22 ENCOUNTER — Ambulatory Visit: Payer: Medicare HMO | Admitting: Cardiology

## 2021-02-03 NOTE — Progress Notes (Deleted)
Cardiology Office Note   Date:  02/03/2021   ID:  Virgin, Zellers Jan 09, 1926, MRN [id number]  PCP:  Orpah Melter, MD  Cardiologist:   Minus Breeding, MD   No chief complaint on file.     History of Present Illness: Debra Chen is a 87 y.o. female who presents for follow up of atrial fib.  Since I last saw him ***   *** her she has done well.  She denies any cardiovascular symptoms other than her heart racing occasionally in the morning.  She had no presyncope or syncope.  She denies any chest pressure, neck or arm discomfort.  She has had no weight gain or edema.   Past Medical History:  Diagnosis Date  . HTN (hypertension)    x 20 years  . MR (mitral regurgitation)    mod  . PAF (paroxysmal atrial fibrillation) ([place])   . TR (tricuspid regurgitation)    mod     Past Surgical History:  Procedure Laterality Date  . TUBAL LIGATION       Current Outpatient Medications  Medication Sig Dispense Refill  . atorvastatin (LIPITOR) 20 MG tablet Take 20 mg by mouth daily.    . benazepril (LOTENSIN) 5 MG tablet Take 1 tablet (5 mg total) by mouth daily. 30 tablet 11  . Calcium Carbonate (CALCIUM 600) 1500 MG TABS Take 1 tablet by mouth daily.      . cefdinir (OMNICEF) 300 MG capsule Take 300 mg by mouth 2 (two) times daily.    . furosemide (LASIX) 20 MG tablet Take 20 mg by mouth daily.    Marland Kitchen gabapentin (NEURONTIN) 100 MG capsule 100 mg. Take 2 tablets by mouth daily    . HYDROcodone-acetaminophen (VICODIN) 5-500 MG per tablet Take 1 tablet by mouth every 6 (six) hours as needed.      . metoprolol succinate (TOPROL-XL) 25 MG 24 hr tablet TAKE 1 TABLET BY MOUTH EVERY DAY 30 tablet 0  . metoprolol tartrate (LOPRESSOR) 25 MG tablet Take 0.5 tablets (12.5 mg total) by mouth 2 (two) times daily. 30 tablet 0  . XARELTO 15 MG TABS tablet Take 15 mg by mouth at bedtime.      No current facility-administered medications for this visit.    Allergies:   Sulfonamide  derivatives   ROS:  Please see the history of present illness.   Otherwise, review of systems are positive for ***.   All other systems are reviewed and negative.    PHYSICAL EXAM: VS:  There were no vitals taken for this visit. , BMI There is no height or weight on file to calculate BMI. GENERAL:  Well appearing NECK:  No jugular venous distention, waveform within normal limits, carotid upstroke brisk and symmetric, no bruits, no thyromegaly LUNGS:  Clear to auscultation bilaterally CHEST:  Unremarkable HEART:  PMI not displaced or sustained,S1 and S2 within normal limits, no S3, no S4, no clicks, no rubs, *** murmurs ABD:  Flat, positive bowel sounds normal in frequency in pitch, no bruits, no rebound, no guarding, no midline pulsatile mass, no hepatomegaly, no splenomegaly EXT:  2 plus pulses throughout, no edema, no cyanosis no clubbing     ***GENERAL:  Well appearing NECK:  No jugular venous distention, waveform within normal limits, carotid upstroke brisk and symmetric, no bruits, no thyromegaly LUNGS:  Clear to auscultation bilaterally CHEST:  Unremarkable HEART:  PMI not displaced or sustained,S1 and S2 within normal limits, no S3,  no clicks,  no rubs, 2 out of 6 apical systolic murmur radiating slightly at the aortic outflow tract, no diastolic murmurs, irregular ABD:  Flat, positive bowel sounds normal in frequency in pitch, no bruits, no rebound, no guarding, no midline pulsatile mass, no hepatomegaly, no splenomegaly EXT:  2 plus pulses throughout, trace edema, no cyanosis no clubbing   EKG:  EKG is *** ordered today. The ekg ordered today demonstrates atrial fibrillation, rate ***, axis within normal limits, intervals within normal limits, poor anterior R wave position, cannot exclude old anteroseptal infarct.  There are nonspecific lateral T wave inversions.  No change from previous.   Recent Labs: 08/21/2020: BUN 19; Creatinine, Ser 1.16; Potassium 4.3; Sodium 144     Lipid Panel No results found for: CHOL, TRIG, HDL, CHOLHDL, VLDL, LDLCALC, LDLDIRECT    Wt Readings from Last 3 Encounters:  09/10/20 149 lb 3.2 oz (67.7 kg)  08/08/20 144 lb 3.2 oz (65.4 kg)  03/19/20 154 lb (69.9 kg)      Other studies Reviewed: Additional studies/ records that were reviewed today include: *** Review of the above records demonstrates:  Please see elsewhere in the note.     ASSESSMENT AND PLAN:  ATRIAL FIB: Debra Chen has a CHA2DS2 - VASc score of 4. ***   She tolerates anticoagulation.   No change in therapy.   I will obtain records of her last CBC.    AAA:In May 2019 was2.9 cm x 3.5 cm.  *** I will get a repeat ultrasound next year.   JQ:GBEE was mild in June of 2018.  She had moderate TR.  *** I will follow this clinically.  DYSLIPIDEMIA:  ***  I will look at those most recent results when available.  HTN:  Her BP has been labive.  ***   Current medicines are reviewed at length with the patient today.  The patient does not have concerns regarding medicines.  The following changes have been made: ***  Labs/ tests ordered today include:  ***  No orders of the defined types were placed in this encounter.    Disposition:   FU with me in ***   Signed, Minus Breeding, MD  02/03/2021 8:13 PM    [place]

## 2021-02-04 ENCOUNTER — Ambulatory Visit: Payer: Medicare HMO | Admitting: Cardiology

## 2021-02-04 DIAGNOSIS — I080 Rheumatic disorders of both mitral and aortic valves: Secondary | ICD-10-CM

## 2021-02-04 DIAGNOSIS — I482 Chronic atrial fibrillation, unspecified: Secondary | ICD-10-CM

## 2021-02-04 DIAGNOSIS — E785 Hyperlipidemia, unspecified: Secondary | ICD-10-CM

## 2021-02-04 DIAGNOSIS — I714 Abdominal aortic aneurysm, without rupture: Secondary | ICD-10-CM

## 2021-02-04 DIAGNOSIS — I10 Essential (primary) hypertension: Secondary | ICD-10-CM

## 2021-03-19 NOTE — [place] Notes (Deleted)
Cardiology Office Note   Date:  03/19/2021   ID:  Debra, Chen 1925-11-19, MRN [id number]  PCP:  Orpah Melter, MD  Cardiologist:   Minus Breeding, MD   No chief complaint on file.     History of Present Illness: Debra Chen is a 87 y.o. female who presents for follow up of atrial fib.  Since I last saw him her she was admitted with acute on chronic diastolic heart failure.  I reviewed these records for this visit.  She had 24 hours of worsening shortness of breath.  She was tachycardic.  Heart rate was 100-1 tens.  This was atrial fibrillation.  BNP was 17,000.  Troponin was mildly elevated but the delta was negative.  Chest x-ray had some acute interstitial edema.  She had new systolic dysfunction with an EF of 35 to 40%.  It was suggested that she have a perfusion study which we can consider doing as an outpatient.  All of this was done at [facility] and I reviewed these records were at [facility].  It was decided not to pursue evaluation of this after further discussion with her family  At the last visit she had hypotension and meds had been held.  She was started back on a low dose of beta blocker.  ***     ***  Of note benazepril HCT was discontinued.  She was treated with antibiotics.  She was started on a low-dose of beta-blocker because of tachycardia.  Of note her daughter did call concerned about starting this.  She has been doing relatively well at home.  She is not been acutely short of breath.  She is not feeling any palpitations.  She said no presyncope or syncope.  Her family is worried about her decreased memory.   Past Medical History:  Diagnosis Date  . HTN (hypertension)    x 20 years  . MR (mitral regurgitation)    mod  . PAF (paroxysmal atrial fibrillation) ([place])   . TR (tricuspid regurgitation)    mod     Past Surgical History:  Procedure Laterality Date  . TUBAL LIGATION       Current Outpatient Medications  Medication Sig Dispense Refill   . atorvastatin (LIPITOR) 20 MG tablet Take 20 mg by mouth daily.    . benazepril (LOTENSIN) 5 MG tablet Take 1 tablet (5 mg total) by mouth daily. 30 tablet 11  . Calcium Carbonate (CALCIUM 600) 1500 MG TABS Take 1 tablet by mouth daily.      . cefdinir (OMNICEF) 300 MG capsule Take 300 mg by mouth 2 (two) times daily.    . furosemide (LASIX) 20 MG tablet Take 20 mg by mouth daily.    Marland Kitchen gabapentin (NEURONTIN) 100 MG capsule 100 mg. Take 2 tablets by mouth daily    . HYDROcodone-acetaminophen (VICODIN) 5-500 MG per tablet Take 1 tablet by mouth every 6 (six) hours as needed.      . metoprolol succinate (TOPROL-XL) 25 MG 24 hr tablet TAKE 1 TABLET BY MOUTH EVERY DAY 30 tablet 0  . metoprolol tartrate (LOPRESSOR) 25 MG tablet Take 0.5 tablets (12.5 mg total) by mouth 2 (two) times daily. 30 tablet 0  . XARELTO 15 MG TABS tablet Take 15 mg by mouth at bedtime.      No current facility-administered medications for this visit.    Allergies:   Sulfonamide derivatives   ROS:  Please see the history of present illness.   Otherwise,  review of systems are positive for ***.   All other systems are reviewed and negative.    PHYSICAL EXAM: VS:  There were no vitals taken for this visit. , BMI There is no height or weight on file to calculate BMI. GENERAL:  Well appearing NECK:  No jugular venous distention, waveform within normal limits, carotid upstroke brisk and symmetric, no bruits, no thyromegaly LUNGS:  Clear to auscultation bilaterally CHEST:  Unremarkable HEART:  PMI not displaced or sustained,S1 and S2 within normal limits, no S3, no clicks, no rubs, *** murmurs, irregular ABD:  Flat, positive bowel sounds normal in frequency in pitch, no bruits, no rebound, no guarding, no midline pulsatile mass, no hepatomegaly, no splenomegaly EXT:  2 plus pulses throughout, no edema, no cyanosis no clubbing     ***GENERAL:  Well appearing NECK:  No jugular venous distention, waveform within normal  limits, carotid upstroke brisk and symmetric, no bruits, no thyromegaly LUNGS:  Clear to auscultation bilaterally CHEST:  Unremarkable HEART:  PMI not displaced or sustained,S1 and S2 within normal limits, no S3, no clicks, no rubs, 2 out of 6 apical systolic murmur radiating out aortic outflow tract, no diastolic murmurs, irregular ABD:  Flat, positive bowel sounds normal in frequency in pitch, no bruits, no rebound, no guarding, no midline pulsatile mass, no hepatomegaly, no splenomegaly EXT:  2 plus pulses throughout, mild right leg edema, no cyanosis no clubbing   EKG:  EKG is *** ordered today. The ekg ordered today demonstrates atrial fibrillation, rate ***, axis within normal limits, intervals within normal limits, poor anterior R wave position, cannot exclude old anteroseptal infarct.  There are nonspecific lateral T wave inversions.  No change from previous.   Recent Labs: 08/21/2020: BUN 19; Creatinine, Ser 1.16; Potassium 4.3; Sodium 144    Lipid Panel No results found for: CHOL, TRIG, HDL, CHOLHDL, VLDL, LDLCALC, LDLDIRECT    Wt Readings from Last 3 Encounters:  09/10/20 149 lb 3.2 oz (67.7 kg)  08/08/20 144 lb 3.2 oz (65.4 kg)  03/19/20 154 lb (69.9 kg)      Other studies Reviewed: Additional studies/ records that were reviewed today include: ***.  Greater than 40 minutes with and the charts more than half the time with the patient Review of the above records demonstrates:  Please see elsewhere in the note.     ASSESSMENT AND PLAN:  ATRIAL FIB: Ms. Debra Chen has a CHA2DS2 - VASc score of 4.  ***  She is now on a low dose of beta blocker.  She tolerates anticoagulation.  No change in therapy.   HYPOTENSION:   This followed some nausea and vomiting.  ***    CARDIOMYOPATHY:   *** Not clear the etiology of this.  However, her family agrees on more conservative management.  I am going to restart a very low dose of Lotensin 5 mg daily.  She should get a basic  metabolic profile again in 2 weeks.  I will probably repeat an echo in the weeks to come.  I am not planning an ischemia work-up at this point as she is having no chest pain.  XL:KGMW was mild in June of 2018.  She had moderate TR . ***   I will follow this up next year as described above.  AS: This was mild.  ***  I will check an echo again in the months to come.  DYSLIPIDEMIA:   LDL *** 72 in February.  No change in therapy.  Current medicines are reviewed at length with the patient today.  The patient does not have concerns regarding medicines.  The following changes have been made:  ***  Labs/ tests ordered today include:  ***  No orders of the defined types were placed in this encounter.    Disposition:   FU with ***   Signed, Minus Breeding, MD  03/19/2021 8:04 PM    [facility]

## 2021-03-20 ENCOUNTER — Ambulatory Visit: Payer: Medicare HMO | Admitting: Cardiology

## 2021-03-20 DIAGNOSIS — I080 Rheumatic disorders of both mitral and aortic valves: Secondary | ICD-10-CM

## 2021-03-20 DIAGNOSIS — E785 Hyperlipidemia, unspecified: Secondary | ICD-10-CM

## 2021-03-20 DIAGNOSIS — I350 Nonrheumatic aortic (valve) stenosis: Secondary | ICD-10-CM

## 2021-03-20 DIAGNOSIS — I429 Cardiomyopathy, unspecified: Secondary | ICD-10-CM

## 2021-04-13 DIAGNOSIS — R300 Dysuria: Secondary | ICD-10-CM | POA: Diagnosis not present

## 2021-04-13 DIAGNOSIS — R1013 Epigastric pain: Secondary | ICD-10-CM | POA: Diagnosis not present

## 2021-05-13 DIAGNOSIS — N183 Chronic kidney disease, stage 3 unspecified: Secondary | ICD-10-CM | POA: Diagnosis not present

## 2021-05-13 DIAGNOSIS — E1142 Type 2 diabetes mellitus with diabetic polyneuropathy: Secondary | ICD-10-CM | POA: Diagnosis not present

## 2021-05-13 DIAGNOSIS — E7800 Pure hypercholesterolemia, unspecified: Secondary | ICD-10-CM | POA: Diagnosis not present

## 2021-05-13 DIAGNOSIS — Z0000 Encounter for general adult medical examination without abnormal findings: Secondary | ICD-10-CM | POA: Diagnosis not present

## 2021-05-13 DIAGNOSIS — G629 Polyneuropathy, unspecified: Secondary | ICD-10-CM | POA: Diagnosis not present

## 2021-05-13 DIAGNOSIS — E1122 Type 2 diabetes mellitus with diabetic chronic kidney disease: Secondary | ICD-10-CM | POA: Diagnosis not present

## 2021-05-13 DIAGNOSIS — I482 Chronic atrial fibrillation, unspecified: Secondary | ICD-10-CM | POA: Diagnosis not present

## 2021-05-13 DIAGNOSIS — M810 Age-related osteoporosis without current pathological fracture: Secondary | ICD-10-CM | POA: Diagnosis not present

## 2021-06-02 ENCOUNTER — Other Ambulatory Visit: Payer: Self-pay | Admitting: Cardiology

## 2021-06-16 DIAGNOSIS — I429 Cardiomyopathy, unspecified: Secondary | ICD-10-CM | POA: Insufficient documentation

## 2021-06-16 NOTE — [place] Notes (Signed)
Cardiology Office Note   Date:  06/17/2021   ID:  Debra Chen, Debra Chen 1926-10-19, MRN [id number]  PCP:  Orpah Melter, MD  Cardiologist:   Minus Breeding, MD   Chief Complaint  Patient presents with   Atrial Fibrillation       History of Present Illness: Debra Chen is a 87 y.o. female who presents for follow up of atrial fib.  Since I last saw him her she has done okay.  She was in the hospital in the fall of last year at [facility] with acute systolic heart failure.  Her EF was newly down to 35 to 40%.  She was not having symptoms and with her advanced age and manage her conservatively.  Since I last saw her she gets around with a rolling walker.  She is mostly bothered by knee pain and does not want to have any surgery on it.  She denies any new shortness of breath, PND or orthopnea.  She will rarely feel palpitations with her atrial fibrillation.  She does not have any presyncope or syncope.  She is not describing any chest pressure, neck or arm discomfort.  She does have a little increased ankle swelling.  Past Medical History:  Diagnosis Date   HTN (hypertension)    x 20 years   MR (mitral regurgitation)    mod   PAF (paroxysmal atrial fibrillation) ([facility])    TR (tricuspid regurgitation)    mod     Past Surgical History:  Procedure Laterality Date   TUBAL LIGATION       Current Outpatient Medications  Medication Sig Dispense Refill   atorvastatin (LIPITOR) 20 MG tablet Take 20 mg by mouth daily.     benazepril (LOTENSIN) 5 MG tablet TAKE 1 TABLET BY MOUTH EVERY DAY 90 tablet 3   furosemide (LASIX) 20 MG tablet Take 20 mg by mouth daily as needed.     gabapentin (NEURONTIN) 100 MG capsule 100 mg. Take 2 tablets by mouth daily     metoprolol succinate (TOPROL-XL) 25 MG 24 hr tablet TAKE 1 TABLET BY MOUTH EVERY DAY 30 tablet 0   XARELTO 15 MG TABS tablet Take 15 mg by mouth at bedtime.      calcium carbonate (OSCAL) 1500 (600 Ca) MG TABS tablet Take 1 tablet by  mouth daily.   (Patient not taking: Reported on 06/17/2021)     cefdinir (OMNICEF) 300 MG capsule Take 300 mg by mouth 2 (two) times daily. (Patient not taking: Reported on 06/17/2021)     HYDROcodone-acetaminophen (VICODIN) 5-500 MG per tablet Take 1 tablet by mouth every 6 (six) hours as needed.   (Patient not taking: Reported on 06/17/2021)     metoprolol tartrate (LOPRESSOR) 25 MG tablet Take 0.5 tablets (12.5 mg total) by mouth 2 (two) times daily. 30 tablet 0   No current facility-administered medications for this visit.    Allergies:   Sulfonamide derivatives   ROS:  Please see the history of present illness.   Otherwise, review of systems are positive for none.   All other systems are reviewed and negative.    PHYSICAL EXAM: VS:  BP 118/78   Pulse 74   Ht '5\' 4"'$  (1.626 m)   Wt 150 lb (68 kg)   BMI 25.75 kg/m  , BMI Body mass index is 25.75 kg/m. GENERAL:  Well appearing NECK:  No jugular venous distention, waveform within normal limits, carotid upstroke brisk and symmetric, slight left carotid bruits, no  thyromegaly LUNGS:  Clear to auscultation bilaterally CHEST:  Unremarkable HEART:  PMI not displaced or sustained,S1 and S2 within normal limits, no S3,  no clicks, no rubs, 3 out of 6 holosystolic murmur heard best at the apex, no diastolic murmurs ABD:  Flat, positive bowel sounds normal in frequency in pitch, no bruits, no rebound, no guarding, no midline pulsatile mass, no hepatomegaly, no splenomegaly EXT:  2 plus pulses throughout, , no cyanosis no clubbing, mild ankle bilateral edema    EKG:  EKG is  ordered today. The ekg ordered today demonstrates atrial fibrillation, rate 74, axis within normal limits, intervals within normal limits, poor anterior R wave position, cannot exclude old anteroseptal infarct.  There are nonspecific lateral T wave inversions.  No change from previous.   Recent Labs: 08/21/2020: BUN 19; Creatinine, Ser 1.16; Potassium 4.3; Sodium 144     Lipid Panel No results found for: CHOL, TRIG, HDL, CHOLHDL, VLDL, LDLCALC, LDLDIRECT    Wt Readings from Last 3 Encounters:  06/17/21 150 lb (68 kg)  09/10/20 149 lb 3.2 oz (67.7 kg)  08/08/20 144 lb 3.2 oz (65.4 kg)      Other studies Reviewed: Additional studies/ records that were reviewed today include: Labs.  Labs Review of the above records demonstrates:  Please see elsewhere in the note.     ASSESSMENT AND PLAN:  ATRIAL FIB:   Debra Chen has a CHA2DS2 - VASc score of 4.  She tolerates anticoagulation.  She is up-to-date with she is now on a low dose of beta blocker.  She tolerates anticoagulation.  No change in therapy.  I like to check a CBC.  CARDIOMYOPATHY:   I am going to reassess this with an echocardiogram to also evaluate her valve issues as well as below.  She has not really had any symptoms and has been sensitive to medications so I will not titrate further.  She is on a lower dose of beta-blocker than previous and I think this is fine.  I am going to have her take an extra dose of Lasix for a day or 2 because she has some mild lower extremity swelling.  She had been drinking a fair amount of fluid and we covered restricting this.   MR:   I will check this with an echocardiogram.  AS: This will be assessed as above.  BRUIT: Is likely transmitted systolic murmur but I would like to check carotid Doppler.  DYSLIPIDEMIA:     LDL 63 in June of last year.  No change in therapy.   Current medicines are reviewed at length with the patient today.  The patient does not have concerns regarding medicines.  The following changes have been made:  None  Labs/ tests ordered today include:    Orders Placed This Encounter  Procedures   US Carotid Bilateral   CBC   EKG 12-Lead   ECHOCARDIOGRAM COMPLETE      Disposition:   FU with me in 6 months.   Signed, Minus Breeding, MD  06/17/2021 4:51 PM    [facility]

## 2021-06-17 ENCOUNTER — Encounter: Payer: Self-pay | Admitting: Cardiology

## 2021-06-17 ENCOUNTER — Other Ambulatory Visit: Payer: Self-pay

## 2021-06-17 ENCOUNTER — Ambulatory Visit (INDEPENDENT_AMBULATORY_CARE_PROVIDER_SITE_OTHER): Payer: Medicare HMO | Admitting: Cardiology

## 2021-06-17 VITALS — BP 118/78 | HR 74 | Ht 64.0 in | Wt 150.0 lb

## 2021-06-17 DIAGNOSIS — I429 Cardiomyopathy, unspecified: Secondary | ICD-10-CM | POA: Diagnosis not present

## 2021-06-17 DIAGNOSIS — I4891 Unspecified atrial fibrillation: Secondary | ICD-10-CM | POA: Diagnosis not present

## 2021-06-17 DIAGNOSIS — I714 Abdominal aortic aneurysm, without rupture, unspecified: Secondary | ICD-10-CM

## 2021-06-17 DIAGNOSIS — Z79899 Other long term (current) drug therapy: Secondary | ICD-10-CM

## 2021-06-17 DIAGNOSIS — I080 Rheumatic disorders of both mitral and aortic valves: Secondary | ICD-10-CM

## 2021-06-17 DIAGNOSIS — R0989 Other specified symptoms and signs involving the circulatory and respiratory systems: Secondary | ICD-10-CM

## 2021-06-17 NOTE — Patient Instructions (Signed)
Medication Instructions:  The current medical regimen is effective;  continue present plan and medications.  *If you need a refill on your cardiac medications before your next appointment, please call your pharmacy*  Lab Work: Please have CBC with results faxed to Dr Percival Spanish [phone]. If you have labs (blood work) drawn today and your tests are completely normal, you will receive your results only by: [place] (if you have MyChart) OR A paper copy in the mail If you have any lab test that is abnormal or we need to change your treatment, we will call you to review the results.  Testing/Procedures: Your physician has requested that you have a carotid duplex. This test is an ultrasound of the carotid arteries in your neck. It looks at blood flow through these arteries that supply the brain with blood. Allow one hour for this exam. There are no restrictions or special instructions.  Your physician has requested that you have an echocardiogram. Echocardiography is a painless test that uses sound waves to create images of your heart. It provides your doctor with information about the size and shape of your heart and how well your heart's chambers and valves are working. This procedure takes approximately one hour. There are no restrictions for this procedure.  Both of the above will be due in October, to be completed at [facility].  You will be contacted to be scheduled.  Follow-Up: At [facility], you and your health needs are our priority.  As part of our continuing mission to provide you with exceptional heart care, we have created designated Provider Care Teams.  These Care Teams include your primary Cardiologist (physician) and Advanced Practice Providers (APPs -  Physician Assistants and Nurse Practitioners) who all work together to provide you with the care you need, when you need it.  We recommend signing up for the patient portal called "MyChart".  Sign up information is  provided on this After Visit Summary.  MyChart is used to connect with patients for Virtual Visits (Telemedicine).  Patients are able to view lab/test results, encounter notes, upcoming appointments, etc.  Non-urgent messages can be sent to your provider as well.   To learn more about what you can do with MyChart, go to [email or web address].    Your next appointment:   6 month(s)  The format for your next appointment:   In Person  Provider:   Minus Breeding, MD   Thank you for choosing [facility]!!

## 2021-07-27 ENCOUNTER — Ambulatory Visit (HOSPITAL_COMMUNITY): Payer: Medicare HMO | Attending: Cardiology

## 2021-08-12 ENCOUNTER — Other Ambulatory Visit (HOSPITAL_COMMUNITY): Payer: Medicare HMO

## 2021-08-12 ENCOUNTER — Ambulatory Visit (HOSPITAL_COMMUNITY): Payer: Medicare HMO

## 2021-08-18 ENCOUNTER — Telehealth (HOSPITAL_COMMUNITY): Payer: Self-pay | Admitting: Cardiology

## 2021-08-18 NOTE — Telephone Encounter (Signed)
Patient was scheduled at [facility] for echo. It was cancelled for reason below:  08/18/2021 11:29 AM FY:[id number], NITASHA D  Cancel Rsn: Patient (cancel per patient, refusing to do exam)

## 2021-08-19 ENCOUNTER — Other Ambulatory Visit (HOSPITAL_COMMUNITY): Payer: Medicare HMO

## 2021-08-19 ENCOUNTER — Ambulatory Visit (HOSPITAL_COMMUNITY): Payer: Medicare HMO

## 2021-08-21 ENCOUNTER — Other Ambulatory Visit (HOSPITAL_COMMUNITY): Payer: Medicare HMO

## 2021-09-30 ENCOUNTER — Telehealth: Payer: Self-pay | Admitting: Cardiology

## 2021-09-30 MED ORDER — BENAZEPRIL HCL 5 MG PO TABS: 5.0000 mg | ORAL_TABLET | Freq: Every day | ORAL | 3 refills | Status: DC

## 2021-09-30 MED ORDER — ATORVASTATIN CALCIUM 20 MG PO TABS: 20.0000 mg | ORAL_TABLET | Freq: Every day | ORAL | 3 refills | Status: DC

## 2021-09-30 NOTE — Telephone Encounter (Signed)
Patient's daughter Isaias Sakai is requesting clarification on all patient's current medications. Please return call to discuss when able.

## 2021-09-30 NOTE — Telephone Encounter (Signed)
Spoke with pt daughter, there has been a change in the person taking care of her medications and they want to confirm her medications. Medications discussed in detail and any refills needed sent to the pharmacy.

## 2021-10-30 DIAGNOSIS — R112 Nausea with vomiting, unspecified: Secondary | ICD-10-CM | POA: Diagnosis not present

## 2021-10-30 DIAGNOSIS — I714 Abdominal aortic aneurysm, without rupture, unspecified: Secondary | ICD-10-CM | POA: Diagnosis not present

## 2021-10-30 DIAGNOSIS — R410 Disorientation, unspecified: Secondary | ICD-10-CM | POA: Diagnosis not present

## 2021-10-30 DIAGNOSIS — I10 Essential (primary) hypertension: Secondary | ICD-10-CM | POA: Diagnosis not present

## 2021-10-30 DIAGNOSIS — R54 Age-related physical debility: Secondary | ICD-10-CM | POA: Diagnosis not present

## 2021-10-30 DIAGNOSIS — I340 Nonrheumatic mitral (valve) insufficiency: Secondary | ICD-10-CM | POA: Diagnosis not present

## 2021-10-30 DIAGNOSIS — I480 Paroxysmal atrial fibrillation: Secondary | ICD-10-CM | POA: Diagnosis not present

## 2021-10-30 DIAGNOSIS — E1122 Type 2 diabetes mellitus with diabetic chronic kidney disease: Secondary | ICD-10-CM | POA: Diagnosis not present

## 2021-12-03 ENCOUNTER — Other Ambulatory Visit: Payer: Self-pay | Admitting: Medical

## 2021-12-15 DIAGNOSIS — I5042 Chronic combined systolic (congestive) and diastolic (congestive) heart failure: Secondary | ICD-10-CM | POA: Insufficient documentation

## 2021-12-15 DIAGNOSIS — I350 Nonrheumatic aortic (valve) stenosis: Secondary | ICD-10-CM | POA: Insufficient documentation

## 2021-12-15 NOTE — [place] Notes (Signed)
Cardiology Office Note   Date:  12/16/2021   ID:  Vannia, Pola [birth year], MRN [id number]  PCP:  Orpah Melter, MD  Cardiologist:   Minus Breeding, MD    Chief Complaint  Patient presents with   Atrial Fibrillation      History of Present Illness: Debra Chen is a 88 y.o. female who presents for follow up of atrial fib.  Since I last saw him her she has done okay.  She was in the hospital in the fall of last year at [facility] with acute systolic heart failure.  Her EF was newly down to 35 to 40%.  She was not having symptoms and with her advanced age and manage her conservatively.  Since I last saw her she canceled a planned echo.  She and her family wanted to be more conservative with any valve issues cardiomyopathy although they have now decided to go ahead with a carotid Doppler which was ordered.  She is done quite well actually.  The patient denies any new symptoms such as chest discomfort, neck or arm discomfort. There has been no new shortness of breath, PND or orthopnea. There have been no reported palpitations, presyncope or syncope.  She gets around with a rolling walker.   Past Medical History:  Diagnosis Date   HTN (hypertension)    x 20 years   MR (mitral regurgitation)    mod   PAF (paroxysmal atrial fibrillation) ([facility])    TR (tricuspid regurgitation)    mod     Past Surgical History:  Procedure Laterality Date   TUBAL LIGATION       Current Outpatient Medications  Medication Sig Dispense Refill   atorvastatin (LIPITOR) 20 MG tablet Take 1 tablet (20 mg total) by mouth daily. 90 tablet 3   benazepril (LOTENSIN) 5 MG tablet Take 1 tablet (5 mg total) by mouth daily. 90 tablet 3   gabapentin (NEURONTIN) 100 MG capsule 100 mg. Take 2 tablets by mouth daily     HYDROcodone-acetaminophen (VICODIN) 5-500 MG per tablet Take 1 tablet by mouth every 6 (six) hours as needed.     metoprolol succinate (TOPROL-XL) 25 MG 24 hr tablet TAKE 1 TABLET BY  MOUTH EVERY DAY 30 tablet 0   ondansetron (ZOFRAN-ODT) 4 MG disintegrating tablet 1 tablet on the tongue and allow to dissolve     XARELTO 15 MG TABS tablet Take 15 mg by mouth at bedtime.      calcium carbonate (OSCAL) 1500 (600 Ca) MG TABS tablet Take 1 tablet by mouth daily.   (Patient not taking: Reported on 06/17/2021)     cefdinir (OMNICEF) 300 MG capsule Take 300 mg by mouth 2 (two) times daily. (Patient not taking: Reported on 06/17/2021)     Echinacea 400 MG CAPS See admin instructions.     furosemide (LASIX) 20 MG tablet Take 20 mg by mouth daily as needed. (Patient not taking: Reported on 12/16/2021)     metoprolol tartrate (LOPRESSOR) 25 MG tablet TAKE 1/2 TABLET TWICE DAILY (Patient not taking: Reported on 12/16/2021) 90 tablet 1   No current facility-administered medications for this visit.    Allergies:   Sulfonamide derivatives   ROS:  Please see the history of present illness.   Otherwise, review of systems are positive for none.   All other systems are reviewed and negative.    PHYSICAL EXAM: VS:  BP 132/80 (BP Location: Left Arm, Patient Position: Sitting, Cuff Size: Normal)  Pulse 74    Wt 148 lb 12.8 oz (67.5 kg)    SpO2 99%    BMI 25.54 kg/m  , BMI Body mass index is 25.54 kg/m. GENERAL:  Well appearing for her age NECK:  No jugular venous distention, waveform within normal limits, carotid upstroke brisk and symmetric, no bruits, no thyromegaly LUNGS:  Clear to auscultation bilaterally CHEST:  Unremarkable HEART:  PMI not displaced or sustained,S1 and S2 within normal limits, no S3, no S4, no clicks, no rubs, 3 out of 6 holosystolic murmur heard best at the apex also holosystolic murmur at the left sternal border, no diastolic murmurs ABD:  Flat, positive bowel sounds normal in frequency in pitch, no bruits, no rebound, no guarding, no midline pulsatile mass, no hepatomegaly, no splenomegaly EXT:  2 plus pulses throughout, mild bilateral ankle edema, no cyanosis no  clubbing   EKG:  EKG is not ordered today.    Recent Labs: No results found for requested labs within last 8760 hours.    Lipid Panel No results found for: CHOL, TRIG, HDL, CHOLHDL, VLDL, LDLCALC, LDLDIRECT    Wt Readings from Last 3 Encounters:  12/16/21 148 lb 12.8 oz (67.5 kg)  06/17/21 150 lb (68 kg)  09/10/20 149 lb 3.2 oz (67.7 kg)      Other studies Reviewed: Additional studies/ records that were reviewed today include:  Labs Review of the above records demonstrates:  Please see elsewhere in the note.     ASSESSMENT AND PLAN:  ATRIAL FIB:   Debra Chen has a CHA2DS2 - VASc score of 4.   She tolerates anticoagulation.  She has had good rate control on low-dose beta-blocker.  No change in therapy.  CARDIOMYOPATHY:   She does not want further aggressive management.  She is quite comfortable and doing well on the meds as listed and is happy with his therapy.  Therefore, no further imaging.  They will let me know if she has any increased swelling or shortness of breath in the future  MR:   As above.  AS:   As above.  BRUIT: This may be a transmitted systolic murmur but they would like to have this evaluated and I agree with carotid Doppler.  DYSLIPIDEMIA:     LDL was 63 with an HDL of 39.  No change in therapy.   Current medicines are reviewed at length with the patient today.  The patient does not have concerns regarding medicines.  The following changes have been made: None  Labs/ tests ordered today include:    Orders Placed This Encounter  Procedures   US Carotid Bilateral      Disposition:   FU with me in 6 months.   Signed, Minus Breeding, MD  12/16/2021 4:17 PM    [facility]

## 2021-12-16 ENCOUNTER — Other Ambulatory Visit: Payer: Self-pay

## 2021-12-16 ENCOUNTER — Ambulatory Visit (INDEPENDENT_AMBULATORY_CARE_PROVIDER_SITE_OTHER): Payer: Medicare HMO | Admitting: Cardiology

## 2021-12-16 ENCOUNTER — Encounter: Payer: Self-pay | Admitting: Cardiology

## 2021-12-16 VITALS — BP 132/80 | HR 74 | Wt 148.8 lb

## 2021-12-16 DIAGNOSIS — I5042 Chronic combined systolic (congestive) and diastolic (congestive) heart failure: Secondary | ICD-10-CM | POA: Diagnosis not present

## 2021-12-16 DIAGNOSIS — E785 Hyperlipidemia, unspecified: Secondary | ICD-10-CM | POA: Diagnosis not present

## 2021-12-16 DIAGNOSIS — I340 Nonrheumatic mitral (valve) insufficiency: Secondary | ICD-10-CM

## 2021-12-16 DIAGNOSIS — I429 Cardiomyopathy, unspecified: Secondary | ICD-10-CM

## 2021-12-16 DIAGNOSIS — R0989 Other specified symptoms and signs involving the circulatory and respiratory systems: Secondary | ICD-10-CM

## 2021-12-16 DIAGNOSIS — I4891 Unspecified atrial fibrillation: Secondary | ICD-10-CM | POA: Diagnosis not present

## 2021-12-16 DIAGNOSIS — I480 Paroxysmal atrial fibrillation: Secondary | ICD-10-CM

## 2021-12-16 DIAGNOSIS — I080 Rheumatic disorders of both mitral and aortic valves: Secondary | ICD-10-CM | POA: Diagnosis not present

## 2021-12-16 DIAGNOSIS — I350 Nonrheumatic aortic (valve) stenosis: Secondary | ICD-10-CM | POA: Diagnosis not present

## 2021-12-16 NOTE — Patient Instructions (Signed)
Medication Instructions:  The current medical regimen is effective;  continue present plan and medications.  *If you need a refill on your cardiac medications before your next appointment, please call your pharmacy*  Testing/Procedures: Your physician has requested that you have a carotid duplex. This test is an ultrasound of the carotid arteries in your neck. It looks at blood flow through these arteries that supply the brain with blood. Allow one hour for this exam. There are no restrictions or special instructions.  This will be completed at [place] will be contacted to be scheduled.    Follow-Up: At [facility], you and your health needs are our priority.  As part of our continuing mission to provide you with exceptional heart care, we have created designated Provider Care Teams.  These Care Teams include your primary Cardiologist (physician) and Advanced Practice Providers (APPs -  Physician Assistants and Nurse Practitioners) who all work together to provide you with the care you need, when you need it.  We recommend signing up for the patient portal called "MyChart".  Sign up information is provided on this After Visit Summary.  MyChart is used to connect with patients for Virtual Visits (Telemedicine).  Patients are able to view lab/test results, encounter notes, upcoming appointments, etc.  Non-urgent messages can be sent to your provider as well.   To learn more about what you can do with MyChart, go to [email or web address].    Your next appointment:   6 month(s)  The format for your next appointment:   In Person  Provider:   Minus Breeding, MD    Thank you for choosing [facility]!!

## 2021-12-17 ENCOUNTER — Telehealth: Payer: Self-pay | Admitting: Cardiology

## 2021-12-17 NOTE — Telephone Encounter (Signed)
Checking percert on the following patient for testing scheduled at [facility].    US CAROTID DUPLEX  12/25/2021

## 2021-12-25 ENCOUNTER — Ambulatory Visit (HOSPITAL_COMMUNITY)
Admission: RE | Admit: 2021-12-25 | Discharge: 2021-12-25 | Disposition: A | Discharge status: Home / Self Care | Payer: Medicare HMO | Source: Ambulatory Visit | Attending: Cardiology | Admitting: Cardiology

## 2021-12-25 ENCOUNTER — Other Ambulatory Visit: Payer: Self-pay

## 2021-12-25 DIAGNOSIS — R0989 Other specified symptoms and signs involving the circulatory and respiratory systems: Secondary | ICD-10-CM | POA: Diagnosis not present

## 2021-12-25 DIAGNOSIS — I6523 Occlusion and stenosis of bilateral carotid arteries: Secondary | ICD-10-CM | POA: Diagnosis not present

## 2021-12-28 ENCOUNTER — Encounter: Payer: Self-pay | Admitting: *Deleted

## 2021-12-31 ENCOUNTER — Telehealth: Payer: Self-pay | Admitting: Cardiology

## 2021-12-31 NOTE — Telephone Encounter (Signed)
Spoke to patient 's daughter, result given . Aware a letters been sent to patient.

## 2021-12-31 NOTE — Telephone Encounter (Signed)
Patient daughter is calling for test results.

## 2021-12-31 NOTE — Telephone Encounter (Signed)
Minus Breeding, MD  12/27/2021  8:25 AM EST     Moderate right carotid plaque.  Follow up in one year.  Repeat Doppler.

## 2022-01-04 DIAGNOSIS — J069 Acute upper respiratory infection, unspecified: Secondary | ICD-10-CM | POA: Diagnosis not present

## 2022-01-06 DIAGNOSIS — C4442 Squamous cell carcinoma of skin of scalp and neck: Secondary | ICD-10-CM | POA: Diagnosis not present

## 2022-01-26 DIAGNOSIS — C4442 Squamous cell carcinoma of skin of scalp and neck: Secondary | ICD-10-CM | POA: Diagnosis not present

## 2022-01-28 DIAGNOSIS — J40 Bronchitis, not specified as acute or chronic: Secondary | ICD-10-CM | POA: Diagnosis not present

## 2022-01-28 DIAGNOSIS — E1122 Type 2 diabetes mellitus with diabetic chronic kidney disease: Secondary | ICD-10-CM | POA: Diagnosis not present

## 2022-01-28 DIAGNOSIS — R051 Acute cough: Secondary | ICD-10-CM | POA: Diagnosis not present

## 2022-01-28 DIAGNOSIS — Z20822 Contact with and (suspected) exposure to covid-19: Secondary | ICD-10-CM | POA: Diagnosis not present

## 2022-01-28 DIAGNOSIS — E1140 Type 2 diabetes mellitus with diabetic neuropathy, unspecified: Secondary | ICD-10-CM | POA: Diagnosis not present

## 2022-01-31 DIAGNOSIS — R062 Wheezing: Secondary | ICD-10-CM | POA: Diagnosis not present

## 2022-01-31 DIAGNOSIS — R051 Acute cough: Secondary | ICD-10-CM | POA: Diagnosis not present

## 2022-01-31 DIAGNOSIS — J189 Pneumonia, unspecified organism: Secondary | ICD-10-CM | POA: Diagnosis not present

## 2022-02-01 DIAGNOSIS — J189 Pneumonia, unspecified organism: Secondary | ICD-10-CM | POA: Diagnosis not present

## 2022-02-01 DIAGNOSIS — R0600 Dyspnea, unspecified: Secondary | ICD-10-CM | POA: Diagnosis not present

## 2022-03-19 DIAGNOSIS — I350 Nonrheumatic aortic (valve) stenosis: Secondary | ICD-10-CM | POA: Diagnosis not present

## 2022-03-19 DIAGNOSIS — I509 Heart failure, unspecified: Secondary | ICD-10-CM | POA: Diagnosis not present

## 2022-03-19 DIAGNOSIS — I872 Venous insufficiency (chronic) (peripheral): Secondary | ICD-10-CM | POA: Diagnosis not present

## 2022-05-13 DIAGNOSIS — Z113 Encounter for screening for infections with a predominantly sexual mode of transmission: Secondary | ICD-10-CM | POA: Diagnosis not present

## 2022-05-13 DIAGNOSIS — F03911 Unspecified dementia, unspecified severity, with agitation: Secondary | ICD-10-CM | POA: Diagnosis not present

## 2022-05-13 DIAGNOSIS — R4182 Altered mental status, unspecified: Secondary | ICD-10-CM | POA: Diagnosis not present

## 2022-05-14 ENCOUNTER — Other Ambulatory Visit (HOSPITAL_BASED_OUTPATIENT_CLINIC_OR_DEPARTMENT_OTHER): Payer: Self-pay | Admitting: Emergency Medicine

## 2022-05-14 DIAGNOSIS — R4182 Altered mental status, unspecified: Secondary | ICD-10-CM

## 2022-06-25 DIAGNOSIS — T148XXA Other injury of unspecified body region, initial encounter: Secondary | ICD-10-CM | POA: Diagnosis not present

## 2022-08-05 ENCOUNTER — Other Ambulatory Visit: Payer: Self-pay | Admitting: Cardiology

## 2022-09-13 DIAGNOSIS — T148XXA Other injury of unspecified body region, initial encounter: Secondary | ICD-10-CM | POA: Diagnosis not present

## 2022-09-20 DIAGNOSIS — G894 Chronic pain syndrome: Secondary | ICD-10-CM | POA: Diagnosis not present

## 2022-09-20 DIAGNOSIS — T148XXA Other injury of unspecified body region, initial encounter: Secondary | ICD-10-CM | POA: Diagnosis not present

## 2022-10-04 DIAGNOSIS — T148XXA Other injury of unspecified body region, initial encounter: Secondary | ICD-10-CM | POA: Diagnosis not present

## 2022-10-04 DIAGNOSIS — S81801D Unspecified open wound, right lower leg, subsequent encounter: Secondary | ICD-10-CM | POA: Diagnosis not present

## 2022-11-30 ENCOUNTER — Other Ambulatory Visit: Payer: Self-pay | Admitting: *Deleted

## 2022-11-30 DIAGNOSIS — I6523 Occlusion and stenosis of bilateral carotid arteries: Secondary | ICD-10-CM

## 2022-11-30 NOTE — [place] Notes (Signed)
vas 

## 2022-12-27 ENCOUNTER — Ambulatory Visit (HOSPITAL_COMMUNITY): Admission: RE | Admit: 2022-12-27 | Payer: Medicare HMO | Source: Ambulatory Visit

## 2022-12-29 ENCOUNTER — Encounter (HOSPITAL_COMMUNITY): Payer: Self-pay

## 2023-03-16 ENCOUNTER — Ambulatory Visit (HOSPITAL_COMMUNITY)
Admission: RE | Admit: 2023-03-16 | Payer: Medicare HMO | Source: Ambulatory Visit | Attending: Cardiology | Admitting: Cardiology

## 2023-03-17 ENCOUNTER — Encounter (HOSPITAL_COMMUNITY): Payer: Self-pay

## 2023-03-24 ENCOUNTER — Other Ambulatory Visit (HOSPITAL_COMMUNITY): Payer: Self-pay | Admitting: Cardiology

## 2023-03-24 DIAGNOSIS — I6523 Occlusion and stenosis of bilateral carotid arteries: Secondary | ICD-10-CM

## 2023-03-28 ENCOUNTER — Ambulatory Visit (HOSPITAL_COMMUNITY)
Admission: RE | Admit: 2023-03-28 | Discharge: 2023-03-28 | Disposition: A | Discharge status: Home / Self Care | Payer: Medicare HMO | Source: Ambulatory Visit | Attending: Cardiovascular Disease | Admitting: Cardiovascular Disease

## 2023-03-28 DIAGNOSIS — I6523 Occlusion and stenosis of bilateral carotid arteries: Secondary | ICD-10-CM | POA: Insufficient documentation

## 2023-03-30 ENCOUNTER — Encounter: Payer: Self-pay | Admitting: *Deleted

## 2023-08-09 DEATH — deceased
